# Patient Record
Sex: Male | Born: 1940 | Race: White | Hispanic: No | Marital: Married | Smoking: Current some day smoker
Health system: Southern US, Community
[De-identification: ages and names within clinical notes are randomized; demographics above are authoritative.]

## PROBLEM LIST (undated history)

## (undated) DIAGNOSIS — F1010 Alcohol abuse, uncomplicated: Secondary | ICD-10-CM

## (undated) DIAGNOSIS — R000 Tachycardia, unspecified: Secondary | ICD-10-CM

## (undated) DIAGNOSIS — E785 Hyperlipidemia, unspecified: Secondary | ICD-10-CM

## (undated) DIAGNOSIS — I429 Cardiomyopathy, unspecified: Secondary | ICD-10-CM

## (undated) DIAGNOSIS — I10 Essential (primary) hypertension: Secondary | ICD-10-CM

## (undated) HISTORY — DX: Essential (primary) hypertension: I10

## (undated) HISTORY — DX: Hyperlipidemia, unspecified: E78.5

---

## 1973-06-24 HISTORY — PX: OTHER SURGICAL HISTORY: SHX169

## 2003-01-18 ENCOUNTER — Ambulatory Visit (HOSPITAL_COMMUNITY): Admission: RE | Admit: 2003-01-18 | Discharge: 2003-01-18 | Payer: Self-pay | Admitting: Family Medicine

## 2003-01-18 ENCOUNTER — Encounter: Payer: Self-pay | Admitting: Family Medicine

## 2008-08-10 ENCOUNTER — Ambulatory Visit (HOSPITAL_COMMUNITY): Admission: RE | Admit: 2008-08-10 | Discharge: 2008-08-10 | Payer: Self-pay | Admitting: Family Medicine

## 2010-01-11 IMAGING — US US AORTA
1 series · 14 of 25 positions shown · non-contrast
Comparison: Report from 01/18/2003

CLINICAL DATA: Pulsatile abdominal mass

AORTA/RETROPERITONEAL ULTRASOUND
TECHNIQUE: Complete retroperitoneal ultrasound examination was
performed including evaluation of the abdominal aorta, IVC, common
iliac vessel origins, and kidneys.

[Series 1: us aorta · 0.32mm/px · 14 of 43 slices shown]
[im 1/43]
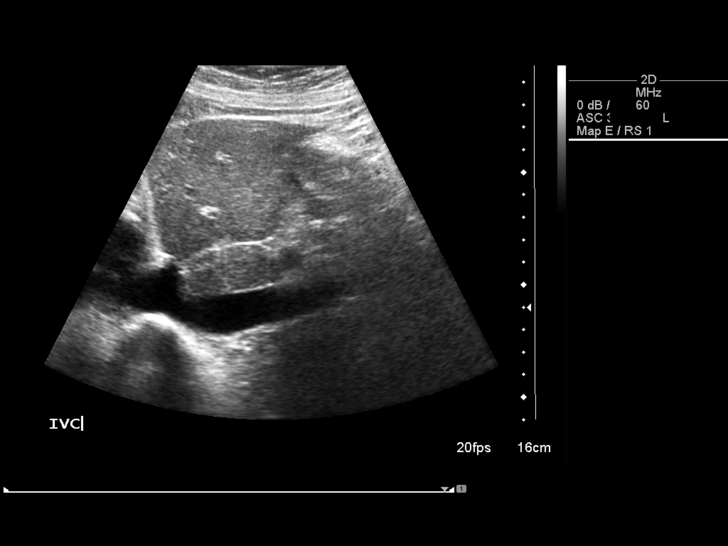
[im 4/43]
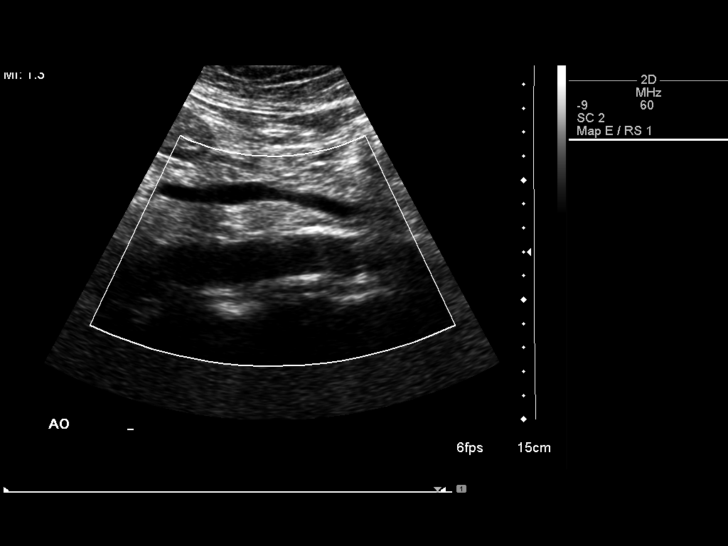
[im 8/43]
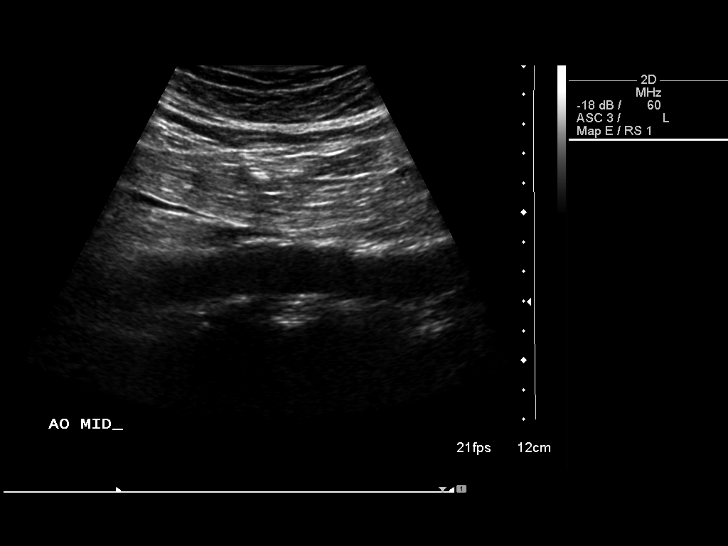
[im 11/43]
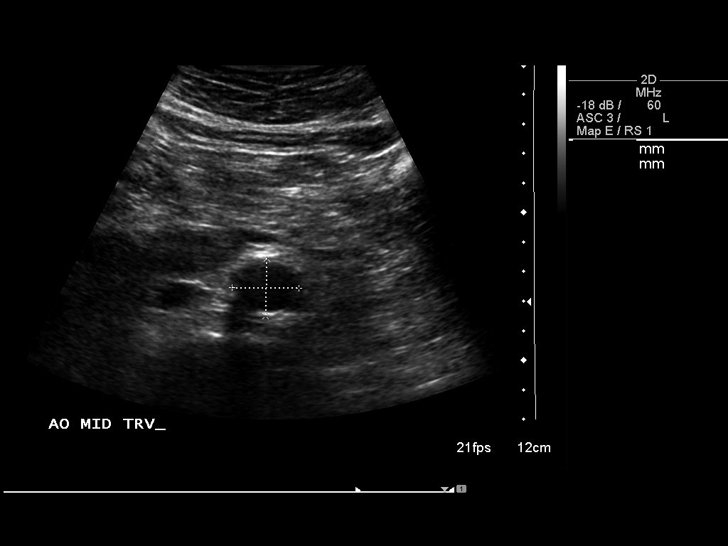
[im 15/43]
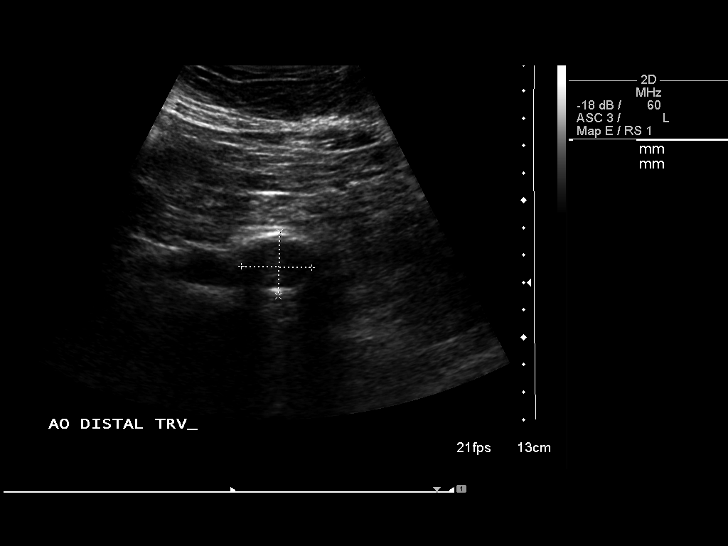
[im 16/43]
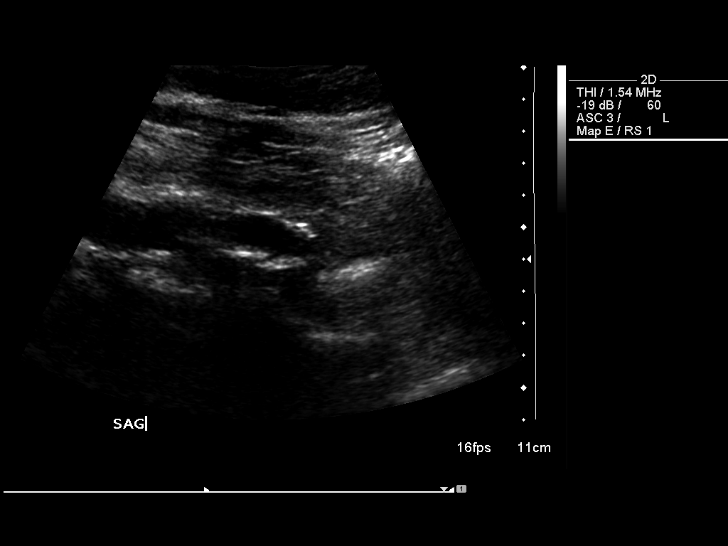
[im 20/43]
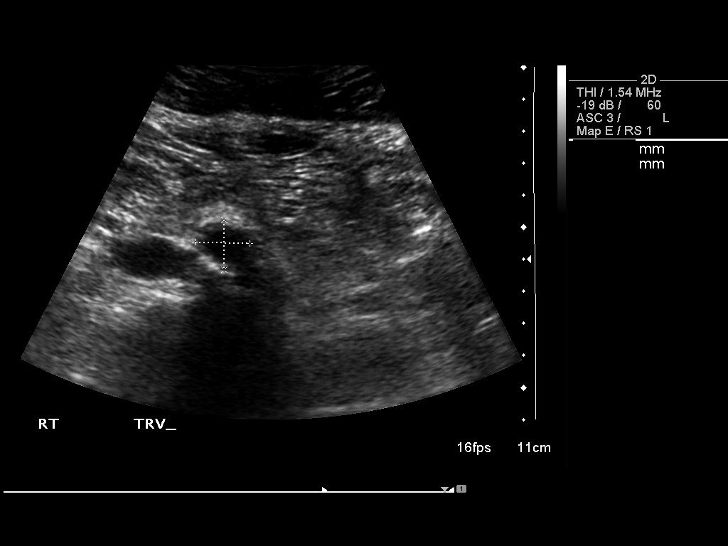
[im 23/43]
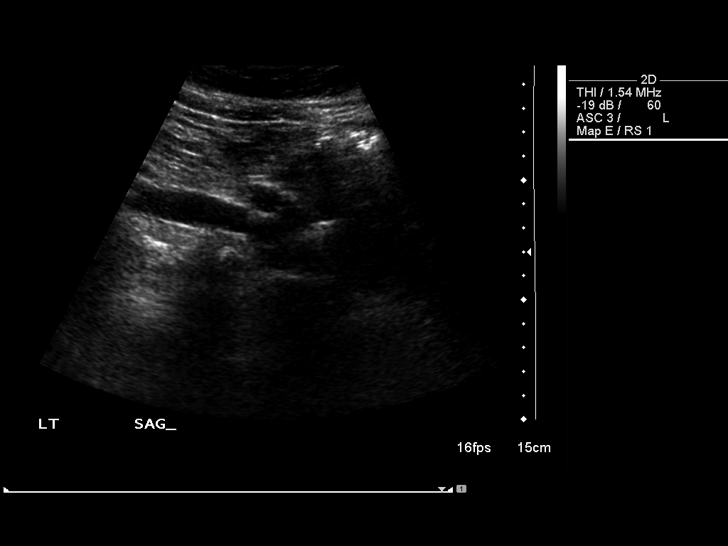
[im 27/43]
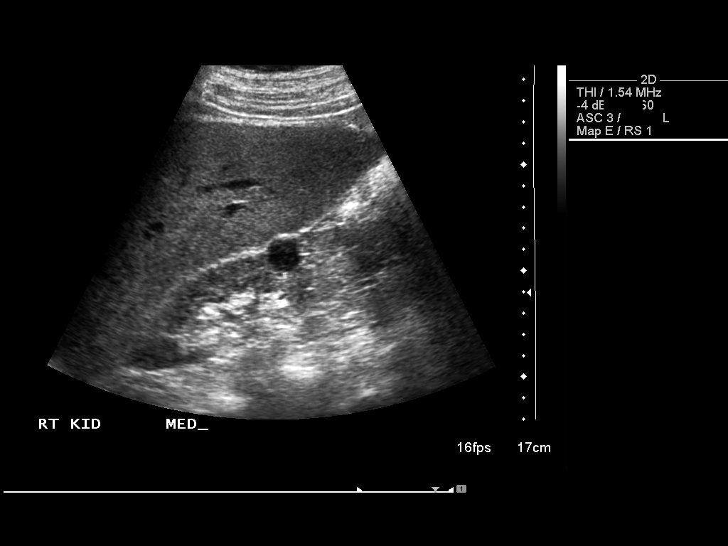
[im 29/43]
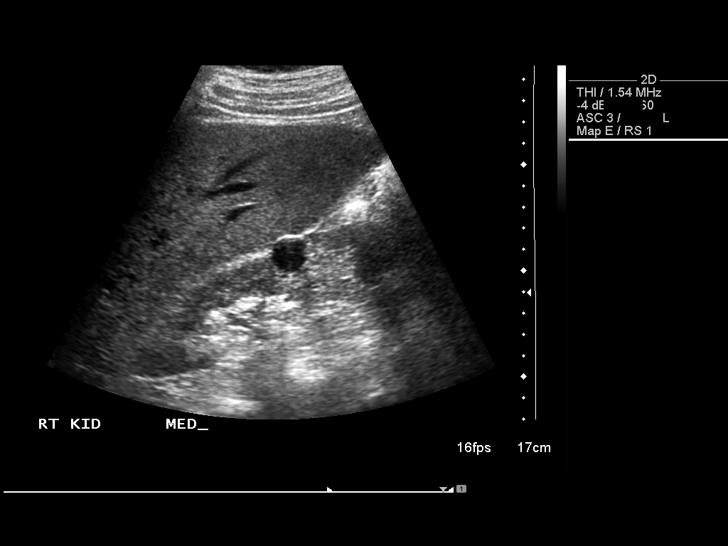
[im 32/43]
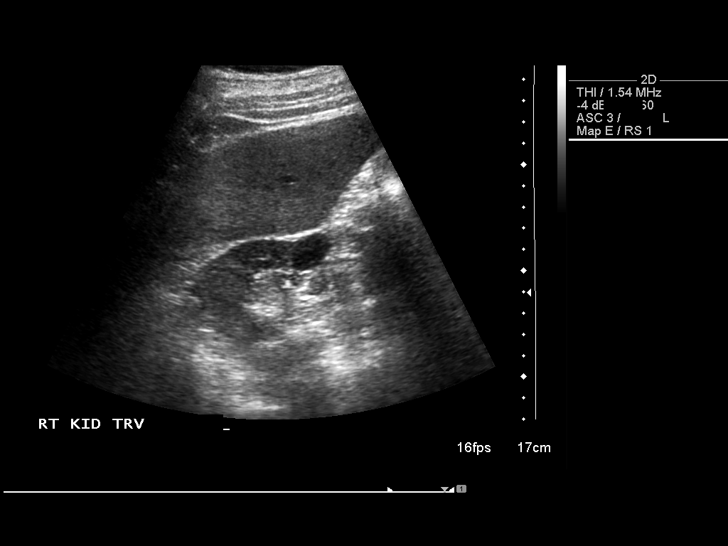
[im 36/43]
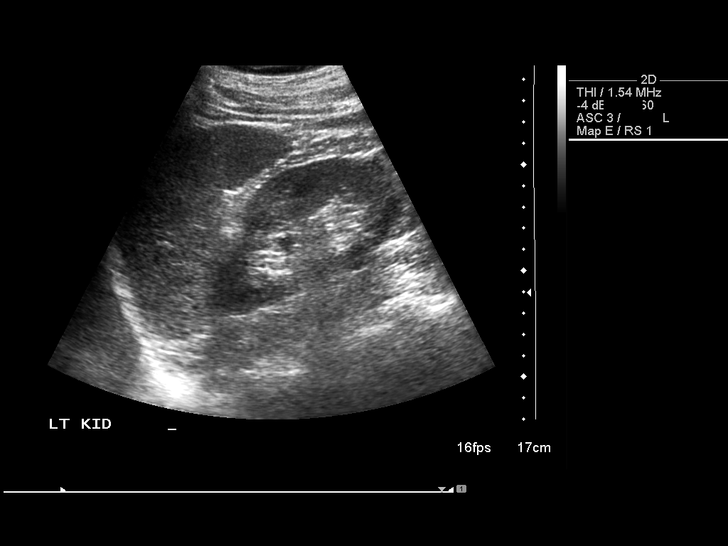
[im 39/43]
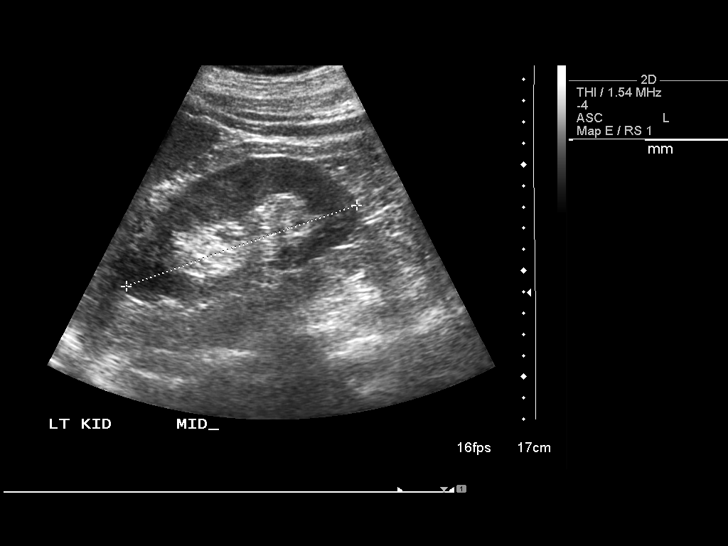
[im 43/43]
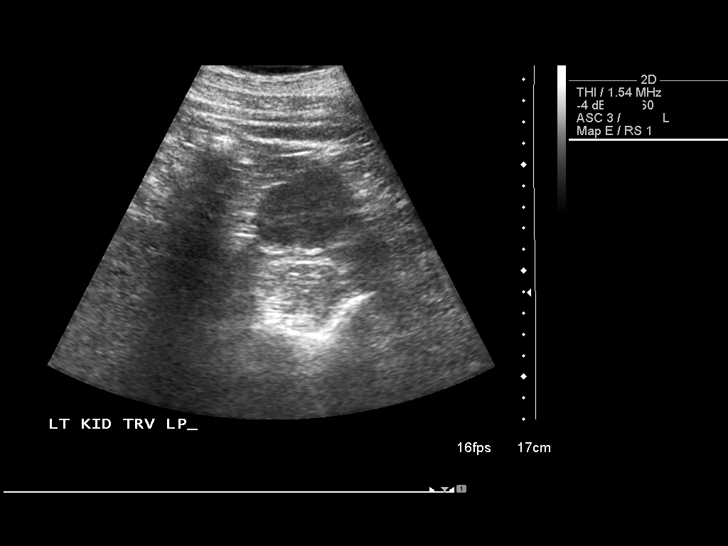

[14 of 25 positions shown; findings below may reference images not displayed]

FINDINGS: Proximally the abdominal aorta measures 2.2 cm AP by
cm transverse.

In the midportion, the abdominal aorta measures 2.0 cm AP by 2.3 cm
transverse.

Distally the abdominal aorta measures 2.3 cm AP by 2.5 cm
transverse.

There is some intimal thickening and mild atheromatous
calcification in the abdominal aorta.

The common iliac arteries do not appear aneurysmal.

The right kidney measures 1.7 cm in greatest length and contains a
1.7 cm simple appearing cyst with enhanced through-transmission in
the mid kidney.

The left kidney measures 11.5 cm in greatest length and appears
unremarkable.

The IVC appears normal.
IMPRESSION: 1.  Mild abdominal aortic ectasia without overt aneurysm.
2.  Simple-appearing right renal cyst.

## 2012-11-20 ENCOUNTER — Telehealth: Payer: Self-pay | Admitting: Family Medicine

## 2012-11-20 DIAGNOSIS — E782 Mixed hyperlipidemia: Secondary | ICD-10-CM

## 2012-11-20 DIAGNOSIS — Z79899 Other long term (current) drug therapy: Secondary | ICD-10-CM

## 2012-11-20 DIAGNOSIS — Z0000 Encounter for general adult medical examination without abnormal findings: Secondary | ICD-10-CM

## 2012-11-20 NOTE — Telephone Encounter (Signed)
Patient has an upcoming appointment 12/02/12 needs paperwork to have blood work completed before this date.  Please call patient when this paperwork is ready.  Thanks

## 2012-11-24 ENCOUNTER — Encounter: Payer: Self-pay | Admitting: *Deleted

## 2012-11-25 NOTE — Telephone Encounter (Signed)
Blood work papers printed and left up front for patient pick up. Patient notified. 

## 2012-11-25 NOTE — Telephone Encounter (Signed)
Glucose, lipid, liver   Rea: hyperlip and screening

## 2012-11-27 LAB — LIPID PANEL
HDL: 33 mg/dL — ABNORMAL LOW (ref >39)
LDL Cholesterol: 96 mg/dL (ref 0–99)

## 2012-11-27 LAB — GLUCOSE, RANDOM: Glucose, Bld: 93 mg/dL (ref 70–99)

## 2012-11-27 LAB — HEPATIC FUNCTION PANEL
ALT: <8 U/L (ref 0–53)
AST: 9 U/L (ref 0–37)
Alkaline Phosphatase: 51 U/L (ref 39–117)
Indirect Bilirubin: 0.5 mg/dL (ref 0.0–0.9)
Total Bilirubin: 0.6 mg/dL (ref 0.3–1.2)

## 2012-12-02 ENCOUNTER — Encounter: Payer: Self-pay | Admitting: Family Medicine

## 2012-12-02 ENCOUNTER — Ambulatory Visit (INDEPENDENT_AMBULATORY_CARE_PROVIDER_SITE_OTHER): Payer: Medicare Other | Admitting: Family Medicine

## 2012-12-02 VITALS — BP 180/86 | Wt 188.4 lb

## 2012-12-02 DIAGNOSIS — I10 Essential (primary) hypertension: Secondary | ICD-10-CM

## 2012-12-02 DIAGNOSIS — J309 Allergic rhinitis, unspecified: Secondary | ICD-10-CM | POA: Insufficient documentation

## 2012-12-02 DIAGNOSIS — E785 Hyperlipidemia, unspecified: Secondary | ICD-10-CM | POA: Insufficient documentation

## 2012-12-02 MED ORDER — PANTOPRAZOLE SODIUM 40 MG PO TBEC: 40.0000 mg | DELAYED_RELEASE_TABLET | Freq: Every day | ORAL | Status: DC

## 2012-12-02 MED ORDER — PRAVASTATIN SODIUM 40 MG PO TABS: 40.0000 mg | ORAL_TABLET | Freq: Every day | ORAL | Status: DC

## 2012-12-02 MED ORDER — LISINOPRIL 20 MG PO TABS: 20.0000 mg | ORAL_TABLET | Freq: Every day | ORAL | Status: DC

## 2012-12-02 MED ORDER — DOXAZOSIN MESYLATE 4 MG PO TABS: 4.0000 mg | ORAL_TABLET | Freq: Every day | ORAL | Status: DC

## 2012-12-02 NOTE — Progress Notes (Signed)
  Subjective:    Patient ID: Kevin Sweeney, male    DOB: 21-May-1941, 72 y.o.   MRN: [id number]  Hyperlipidemia This is a chronic problem. The current episode started more than 1 year ago. Current antihyperlipidemic treatment includes statins.  Hypertension This is a chronic problem. The current episode started more than 1 year ago.  Patient does smoke he knows needs quit he does take his medicines as directed he denies any chest tightness pressure pain shortness breath swelling in legs denies hematuria denies rectal bleeding knows he needs a wellness exam later this year. Takes his medicine as directed. Family history noncontributory.    Review of Systems    ROS see per above Objective:   Physical Exam  Blood pressure elevated but even on recheck moderately elevated 160/80 he states her blood pressure readings outside the office are very good. Lungs are clear hearts regular abdomen soft extremities no edema skin warm dry      Assessment & Plan:  Hypertension continue medication as directed. We recheck blood pressure as an outpatient. As long as his readings are doing good at home been there is no other intervention necessary. Followup if ongoing troubles. Hyperlipidemia lab work looks good continue current measures followup if ongoing troubles Allergic rhinitis Flonase and OTC measures if ongoing troubles may need Depo-Medrol shot. Advise patient to quit smoking Wellness exam later this year.

## 2013-03-28 ENCOUNTER — Other Ambulatory Visit: Payer: Self-pay | Admitting: Family Medicine

## 2013-04-01 ENCOUNTER — Ambulatory Visit (INDEPENDENT_AMBULATORY_CARE_PROVIDER_SITE_OTHER): Payer: Medicare Other | Admitting: *Deleted

## 2013-04-01 DIAGNOSIS — Z23 Encounter for immunization: Secondary | ICD-10-CM

## 2013-08-24 ENCOUNTER — Telehealth: Payer: Self-pay | Admitting: Family Medicine

## 2013-08-24 ENCOUNTER — Other Ambulatory Visit: Payer: Self-pay | Admitting: *Deleted

## 2013-08-24 DIAGNOSIS — I10 Essential (primary) hypertension: Secondary | ICD-10-CM

## 2013-08-24 DIAGNOSIS — E785 Hyperlipidemia, unspecified: Secondary | ICD-10-CM

## 2013-08-24 DIAGNOSIS — Z125 Encounter for screening for malignant neoplasm of prostate: Secondary | ICD-10-CM

## 2013-08-24 DIAGNOSIS — Z79899 Other long term (current) drug therapy: Secondary | ICD-10-CM

## 2013-08-24 NOTE — Telephone Encounter (Signed)
Patient needs order for blood work. °

## 2013-08-24 NOTE — Telephone Encounter (Signed)
Notified patient via VM stating blood work orders are in. 

## 2013-08-24 NOTE — Telephone Encounter (Signed)
Lipid, liver, metabolic 7, PSA, will also need followup office visit

## 2013-08-24 NOTE — Telephone Encounter (Signed)
11/27/12: Glucose, lipid, liver

## 2013-08-30 LAB — LIPID PANEL
Cholesterol: 145 mg/dL (ref 0–200)
HDL: 34 mg/dL — AB (ref >39)
LDL Cholesterol: 84 mg/dL (ref 0–99)
Total CHOL/HDL Ratio: 4.3 Ratio
Triglycerides: 135 mg/dL (ref <150)
VLDL: 27 mg/dL (ref 0–40)

## 2013-08-30 LAB — BASIC METABOLIC PANEL
BUN: 9 mg/dL (ref 6–23)
CALCIUM: 9.5 mg/dL (ref 8.4–10.5)
CO2: 28 mEq/L (ref 19–32)
CREATININE: 0.63 mg/dL (ref 0.50–1.35)
Chloride: 96 mEq/L (ref 96–112)
GLUCOSE: 94 mg/dL (ref 70–99)
Potassium: 4.3 mEq/L (ref 3.5–5.3)
SODIUM: 133 meq/L — AB (ref 135–145)

## 2013-08-30 LAB — HEPATIC FUNCTION PANEL
ALBUMIN: 4.5 g/dL (ref 3.5–5.2)
ALT: 8 U/L (ref 0–53)
AST: 9 U/L (ref 0–37)
Alkaline Phosphatase: 52 U/L (ref 39–117)
BILIRUBIN INDIRECT: 0.5 mg/dL (ref 0.2–1.2)
BILIRUBIN TOTAL: 0.6 mg/dL (ref 0.2–1.2)
Bilirubin, Direct: 0.1 mg/dL (ref 0.0–0.3)
Total Protein: 6.6 g/dL (ref 6.0–8.3)

## 2013-08-31 LAB — PSA, MEDICARE: PSA: 1.05 ng/mL (ref <=4.00)

## 2013-09-03 ENCOUNTER — Ambulatory Visit (INDEPENDENT_AMBULATORY_CARE_PROVIDER_SITE_OTHER): Payer: Medicare Other | Admitting: Family Medicine

## 2013-09-03 ENCOUNTER — Encounter: Payer: Self-pay | Admitting: Family Medicine

## 2013-09-03 VITALS — BP 148/78 | Ht 71.0 in | Wt 192.0 lb

## 2013-09-03 DIAGNOSIS — E785 Hyperlipidemia, unspecified: Secondary | ICD-10-CM

## 2013-09-03 DIAGNOSIS — I10 Essential (primary) hypertension: Secondary | ICD-10-CM

## 2013-09-03 DIAGNOSIS — J309 Allergic rhinitis, unspecified: Secondary | ICD-10-CM

## 2013-09-03 DIAGNOSIS — Z85038 Personal history of other malignant neoplasm of large intestine: Secondary | ICD-10-CM

## 2013-09-03 MED ORDER — LISINOPRIL 20 MG PO TABS: 20.0000 mg | ORAL_TABLET | Freq: Every day | ORAL | Status: DC

## 2013-09-03 MED ORDER — PANTOPRAZOLE SODIUM 40 MG PO TBEC: 40.0000 mg | DELAYED_RELEASE_TABLET | Freq: Every day | ORAL | Status: DC

## 2013-09-03 MED ORDER — DOXAZOSIN MESYLATE 4 MG PO TABS
4.0000 mg | ORAL_TABLET | Freq: Every day | ORAL | Status: DC
Start: 2013-09-03 — End: 2014-05-11

## 2013-09-03 MED ORDER — PRAVASTATIN SODIUM 40 MG PO TABS: 40.0000 mg | ORAL_TABLET | Freq: Every day | ORAL | Status: DC

## 2013-09-03 MED ORDER — FLUTICASONE PROPIONATE 50 MCG/ACT NA SUSP: NASAL | Status: DC

## 2013-09-03 NOTE — Progress Notes (Signed)
   Subjective:    Patient ID: Kevin Sweeney, male    DOB: 23-Mar-1941, 73 y.o.   MRN: [id number]  HPI Patient is here today for a check up. Patient was talked to at length about the importance of exercise watching diet. He is doing a good job taken his medicine. He denies any acute issues currently. Had his blood work done. We also talked about preventative health he does not want prostate exam or colonoscopy. He recently got BW done.  Suffering from allergies. Patient does have significant allergies sneezing congestion drainage coughing denies any other particular troubles currently  Patient not smoking he use to but no longer does  Review of Systems  Constitutional: Negative for activity change, appetite change and fatigue.  Respiratory: Negative for cough, choking and chest tightness.   Cardiovascular: Negative for chest pain and leg swelling.  Gastrointestinal: Negative for abdominal pain and abdominal distention.  Endocrine: Negative for polydipsia and polyphagia.  Neurological: Negative for weakness.  Psychiatric/Behavioral: Negative for confusion.       Objective:   Physical Exam  Vitals reviewed. Constitutional: He appears well-nourished. No distress.  Cardiovascular: Normal rate, regular rhythm and normal heart sounds.   No murmur heard. Pulmonary/Chest: Effort normal and breath sounds normal. No respiratory distress.  Abdominal: Soft. He exhibits no distension. There is no tenderness. There is no guarding.  Musculoskeletal: He exhibits no edema.  Lymphadenopathy:    He has no cervical adenopathy.  Neurological: He is alert.  Psychiatric: His behavior is normal.          Assessment & Plan:  #1 allergic rhinitis try Allegra continue Flonase if ongoing trouble followup  #2 hyperlipidemia under good control continue current measures  HTN blood pressure recheck actually better. Continue current medication watch diet patient does not smoke cigarettes an occasional  cigar. He also states not consuming alcohol currently.  Patient defers on colonoscopy agrees to Hemoccult cards x3.

## 2013-09-08 ENCOUNTER — Other Ambulatory Visit: Payer: Self-pay | Admitting: *Deleted

## 2013-09-08 ENCOUNTER — Telehealth: Payer: Self-pay | Admitting: Family Medicine

## 2013-09-08 MED ORDER — PRAVASTATIN SODIUM 40 MG PO TABS: 40.0000 mg | ORAL_TABLET | Freq: Two times a day (BID) | ORAL | Status: DC

## 2013-09-08 NOTE — Telephone Encounter (Signed)
Pt is currently on 40mg  PRAVACHOL 2 tablets daily, insurance is requiring a quantity limit override, can we change pt to 80mg  once daily (should eliminate need for quantity limit override form) Please advise.  If "ok" to change dose, please send in to [facility]

## 2013-09-13 ENCOUNTER — Other Ambulatory Visit: Payer: Self-pay | Admitting: *Deleted

## 2013-09-13 MED ORDER — PRAVASTATIN SODIUM 40 MG PO TABS: ORAL_TABLET | ORAL | Status: DC

## 2013-09-20 MED ORDER — PRAVASTATIN SODIUM 80 MG PO TABS: 80.0000 mg | ORAL_TABLET | Freq: Every day | ORAL | Status: DC

## 2013-09-20 NOTE — Telephone Encounter (Signed)
Nurses may send in script for 80 mg one daily, may do 30 or 90 day whatever pt prefers, with 6 months refills

## 2013-09-20 NOTE — Telephone Encounter (Signed)
Please see previous message, can we change pt to 80mg  daily of Pravachol?  Insurance is requiring reason for 40mg  BID dose.  Please advise

## 2013-09-20 NOTE — Telephone Encounter (Signed)
Medication sent; patient notified and verbalized understanding

## 2013-10-07 ENCOUNTER — Ambulatory Visit (INDEPENDENT_AMBULATORY_CARE_PROVIDER_SITE_OTHER): Payer: Medicare Other | Admitting: Family Medicine

## 2013-10-07 ENCOUNTER — Encounter: Payer: Self-pay | Admitting: Family Medicine

## 2013-10-07 VITALS — BP 180/90 | Temp 98.5°F | Ht 71.0 in | Wt 188.4 lb

## 2013-10-07 DIAGNOSIS — J309 Allergic rhinitis, unspecified: Secondary | ICD-10-CM

## 2013-10-07 DIAGNOSIS — J302 Other seasonal allergic rhinitis: Secondary | ICD-10-CM

## 2013-10-07 MED ORDER — METHYLPREDNISOLONE ACETATE 80 MG/ML IJ SUSP
80.0000 mg | Freq: Once | INTRAMUSCULAR | Status: AC
  Administered 2013-10-07: 80 mg via INTRAMUSCULAR

## 2013-10-07 NOTE — Progress Notes (Signed)
   Subjective:    Patient ID: Kevin Sweeney, male    DOB: 20-May-1941, 73 y.o.   MRN: [id number]  Sinusitis This is a new problem. The current episode started in the past 7 days. The problem is unchanged. There has been no fever. The pain is mild. Associated symptoms include congestion, headaches and sneezing. (Watery eyes) Past treatments include spray decongestants (allegra). The treatment provided no relief.   Patient states he has no other concerns at this time.   No vomiting or diarrhea. Review of Systems  HENT: Positive for congestion and sneezing.   Neurological: Positive for headaches.   ROS otherwise negative    Objective:   Physical Exam  Alert mild malaise. HEENT moderate nasal congestion clear discharge. Eyes injected pharynx normal neck supple. Lungs clear. Heart regular in rhythm.  Blood pressure improved to 152/84.    Assessment & Plan:  Impression significant allergic rhinitis plan maintain antihistamine and steroid nasal spray. Add steroid injection. Rationale discussed. WSL

## 2014-03-02 ENCOUNTER — Ambulatory Visit: Payer: Medicare Other | Admitting: Family Medicine

## 2014-04-13 ENCOUNTER — Ambulatory Visit (INDEPENDENT_AMBULATORY_CARE_PROVIDER_SITE_OTHER): Payer: Medicare Other | Admitting: *Deleted

## 2014-04-13 DIAGNOSIS — Z23 Encounter for immunization: Secondary | ICD-10-CM

## 2014-04-27 ENCOUNTER — Telehealth: Payer: Self-pay | Admitting: Family Medicine

## 2014-04-27 DIAGNOSIS — Z79899 Other long term (current) drug therapy: Secondary | ICD-10-CM

## 2014-04-27 DIAGNOSIS — E785 Hyperlipidemia, unspecified: Secondary | ICD-10-CM

## 2014-04-27 NOTE — Telephone Encounter (Signed)
Met 7 lipid liv

## 2014-04-27 NOTE — Telephone Encounter (Signed)
Patient needs order for blood work for upcoming appointment on 05/11/14.

## 2014-04-27 NOTE — Telephone Encounter (Signed)
Last labs 08/30/13 BMP, PSA, Lipid, Liver

## 2014-04-28 NOTE — Telephone Encounter (Signed)
Left message on voicemail notifying patient that blood work has been ordered.  

## 2014-05-06 LAB — HEPATIC FUNCTION PANEL
ALK PHOS: 57 U/L (ref 39–117)
ALT: 8 U/L (ref 0–53)
AST: 9 U/L (ref 0–37)
Albumin: 4.3 g/dL (ref 3.5–5.2)
BILIRUBIN DIRECT: 0.1 mg/dL (ref 0.0–0.3)
BILIRUBIN TOTAL: 0.5 mg/dL (ref 0.2–1.2)
Indirect Bilirubin: 0.4 mg/dL (ref 0.2–1.2)
Total Protein: 6.4 g/dL (ref 6.0–8.3)

## 2014-05-06 LAB — BASIC METABOLIC PANEL
BUN: 7 mg/dL (ref 6–23)
CALCIUM: 9.3 mg/dL (ref 8.4–10.5)
CHLORIDE: 91 meq/L — AB (ref 96–112)
CO2: 27 meq/L (ref 19–32)
CREATININE: 0.64 mg/dL (ref 0.50–1.35)
GLUCOSE: 100 mg/dL — AB (ref 70–99)
Potassium: 4.3 mEq/L (ref 3.5–5.3)
Sodium: 129 mEq/L — ABNORMAL LOW (ref 135–145)

## 2014-05-06 LAB — LIPID PANEL
Cholesterol: 138 mg/dL (ref 0–200)
HDL: 36 mg/dL — ABNORMAL LOW (ref >39)
LDL Cholesterol: 77 mg/dL (ref 0–99)
Total CHOL/HDL Ratio: 3.8 Ratio
Triglycerides: 124 mg/dL (ref <150)
VLDL: 25 mg/dL (ref 0–40)

## 2014-05-11 ENCOUNTER — Encounter: Payer: Self-pay | Admitting: Family Medicine

## 2014-05-11 ENCOUNTER — Ambulatory Visit (INDEPENDENT_AMBULATORY_CARE_PROVIDER_SITE_OTHER): Payer: Medicare Other | Admitting: Family Medicine

## 2014-05-11 VITALS — BP 170/90 | Ht 71.0 in | Wt 180.0 lb

## 2014-05-11 DIAGNOSIS — I10 Essential (primary) hypertension: Secondary | ICD-10-CM

## 2014-05-11 DIAGNOSIS — Z23 Encounter for immunization: Secondary | ICD-10-CM

## 2014-05-11 DIAGNOSIS — E785 Hyperlipidemia, unspecified: Secondary | ICD-10-CM

## 2014-05-11 DIAGNOSIS — E871 Hypo-osmolality and hyponatremia: Secondary | ICD-10-CM

## 2014-05-11 DIAGNOSIS — Z1212 Encounter for screening for malignant neoplasm of rectum: Secondary | ICD-10-CM

## 2014-05-11 MED ORDER — PRAVASTATIN SODIUM 80 MG PO TABS: 80.0000 mg | ORAL_TABLET | Freq: Every day | ORAL | Status: DC

## 2014-05-11 MED ORDER — LISINOPRIL 20 MG PO TABS: 20.0000 mg | ORAL_TABLET | Freq: Every day | ORAL | Status: DC

## 2014-05-11 MED ORDER — DOXAZOSIN MESYLATE 4 MG PO TABS: 4.0000 mg | ORAL_TABLET | Freq: Every day | ORAL | Status: DC

## 2014-05-11 MED ORDER — AMLODIPINE BESYLATE 2.5 MG PO TABS: 2.5000 mg | ORAL_TABLET | Freq: Every day | ORAL | Status: DC

## 2014-05-11 MED ORDER — PANTOPRAZOLE SODIUM 40 MG PO TBEC: 40.0000 mg | DELAYED_RELEASE_TABLET | Freq: Every day | ORAL | Status: DC

## 2014-05-11 NOTE — Progress Notes (Signed)
   Subjective:    Patient ID: Kevin Sweeney, male    DOB: May 05, 1941, 73 y.o.   MRN: [id number]  Hypertension This is a chronic problem. The current episode started more than 1 year ago. The problem is controlled.   Had BW done. 25 minutes spent with patient covering multiple items Suffers from environmental allergies. Significant time was spent discussing the patient's health he is trying to eat healthy stay physically active. He denies appetite change and has lost some weight denies any chronic cough denies hemoptysis or rectal bleeding. Patient was advised to get colonoscopy he defers on this we will do Hemoccult cards 3 No new concerns.   Review of Systems     Objective:   Physical Exam Lungs clear heart regular Abdomen soft Extremities no edema Skin warm dry       Assessment & Plan:  hyponatremia-we will need to recheck this again in several weeks if it is still low he will need to have significant workup for this Allergy issues use medications as directed Hyperlipidemia good control continue current medications Reflux good control continue current medication HTN addamlodipine 2.5 mg daily Follow-up patient in approximate 4-6 weeks to recheck blood pressure

## 2014-06-03 LAB — BASIC METABOLIC PANEL
BUN: 6 mg/dL (ref 6–23)
CALCIUM: 9.3 mg/dL (ref 8.4–10.5)
CO2: 29 mEq/L (ref 19–32)
CREATININE: 0.67 mg/dL (ref 0.50–1.35)
Chloride: 96 mEq/L (ref 96–112)
GLUCOSE: 90 mg/dL (ref 70–99)
Potassium: 4.1 mEq/L (ref 3.5–5.3)
Sodium: 131 mEq/L — ABNORMAL LOW (ref 135–145)

## 2014-06-08 ENCOUNTER — Ambulatory Visit (INDEPENDENT_AMBULATORY_CARE_PROVIDER_SITE_OTHER): Payer: Medicare Other | Admitting: Family Medicine

## 2014-06-08 ENCOUNTER — Encounter: Payer: Self-pay | Admitting: Family Medicine

## 2014-06-08 VITALS — BP 170/94 | Ht 71.0 in | Wt 178.0 lb

## 2014-06-08 DIAGNOSIS — I10 Essential (primary) hypertension: Secondary | ICD-10-CM

## 2014-06-08 DIAGNOSIS — R5383 Other fatigue: Secondary | ICD-10-CM

## 2014-06-08 DIAGNOSIS — R05 Cough: Secondary | ICD-10-CM

## 2014-06-08 DIAGNOSIS — R059 Cough, unspecified: Secondary | ICD-10-CM

## 2014-06-08 DIAGNOSIS — E871 Hypo-osmolality and hyponatremia: Secondary | ICD-10-CM | POA: Insufficient documentation

## 2014-06-08 MED ORDER — AMLODIPINE BESYLATE 5 MG PO TABS: ORAL_TABLET | ORAL | Status: DC

## 2014-06-08 NOTE — Progress Notes (Signed)
   Subjective:    Patient ID: Kevin Sweeney, male    DOB: May 10, 1941, 73 y.o.   MRN: [id number]  Hypertension This is a chronic problem. The current episode started more than 1 year ago. Pertinent negatives include no chest pain. Treatments tried: amlodipine, lisinopril. There are no compliance problems.   Walks and plays golf. Could do better with diet.  Started amlodipine a few weeks ago. Pt states he has felt tired since starting the med.  Patient brought in blood pressure readings today.  No other concerns today.     Review of Systems  Constitutional: Negative for activity change, appetite change and fatigue.  HENT: Negative for congestion.   Respiratory: Negative for cough.   Cardiovascular: Negative for chest pain.  Gastrointestinal: Negative for abdominal pain.  Endocrine: Negative for polydipsia and polyphagia.  Neurological: Negative for weakness.  Psychiatric/Behavioral: Negative for confusion.       Objective:   Physical Exam  Constitutional: He appears well-nourished. No distress.  Cardiovascular: Normal rate, regular rhythm and normal heart sounds.   No murmur heard. Pulmonary/Chest: Effort normal and breath sounds normal. No respiratory distress.  Musculoskeletal: He exhibits no edema.  Lymphadenopathy:    He has no cervical adenopathy.  Neurological: He is alert.  Psychiatric: His behavior is normal.  Vitals reviewed.         Assessment & Plan:  #1 HTN he brought in a bunch her readings I looked over those about 40% of the readings have elevated systolic blood pressure therefore I recommend increasing amlodipine 5 mg daily. He will send [place] readings in a few weeks time he will follow-up in 3 months with [place]  #2 he states he takes a large amount of liquids we will be checking some further testing to rule out other causes of hyponatremia but he will try to reduce the amount of fluids he takes then and he will get these tests done within the next 2 weeks   #3  intermittent cough history of smoking in the past I recommend a chest x-ray I doubt SIADH but need to make sure there is no no sign of any growth in the lung.. Patient denies any fevers weight loss hemoptysis or ill filling

## 2014-08-15 ENCOUNTER — Telehealth: Payer: Self-pay | Admitting: Family Medicine

## 2014-08-15 NOTE — Telephone Encounter (Signed)
Pt dropped of blood pressure readings. In basket in folder.

## 2014-08-18 NOTE — Telephone Encounter (Signed)
Discussed with patient

## 2014-08-18 NOTE — Telephone Encounter (Signed)
Blood pressure readings overall look good occasionally the readings are high but the vast majority look good. If we increase the dose of his medication he is more likely to have side effects we will keep everything as is. Follow-up in the spring.

## 2014-09-13 ENCOUNTER — Other Ambulatory Visit: Payer: Self-pay | Admitting: Family Medicine

## 2014-09-28 ENCOUNTER — Encounter: Payer: Self-pay | Admitting: Family Medicine

## 2014-09-28 LAB — BASIC METABOLIC PANEL
BUN: 8 mg/dL (ref 6–23)
CALCIUM: 9.3 mg/dL (ref 8.4–10.5)
CO2: 28 meq/L (ref 19–32)
CREATININE: 0.62 mg/dL (ref 0.50–1.35)
Chloride: 98 mEq/L (ref 96–112)
Glucose, Bld: 101 mg/dL — ABNORMAL HIGH (ref 70–99)
Potassium: 4.1 mEq/L (ref 3.5–5.3)
Sodium: 136 mEq/L (ref 135–145)

## 2014-09-28 LAB — SODIUM, URINE, RANDOM: Sodium, Ur: 194 mEq/L

## 2014-09-28 LAB — CORTISOL: Cortisol, Plasma: 15 ug/dL

## 2014-09-28 LAB — TSH: TSH: 0.64 u[IU]/mL (ref 0.350–4.500)

## 2014-09-29 ENCOUNTER — Ambulatory Visit: Payer: Medicare Other | Admitting: Family Medicine

## 2015-02-21 ENCOUNTER — Other Ambulatory Visit: Payer: Self-pay | Admitting: Family Medicine

## 2015-03-22 ENCOUNTER — Ambulatory Visit (INDEPENDENT_AMBULATORY_CARE_PROVIDER_SITE_OTHER): Payer: PPO | Admitting: Family Medicine

## 2015-03-22 ENCOUNTER — Encounter: Payer: Self-pay | Admitting: Family Medicine

## 2015-03-22 VITALS — BP 160/90 | Ht 71.0 in | Wt 174.4 lb

## 2015-03-22 DIAGNOSIS — Z79899 Other long term (current) drug therapy: Secondary | ICD-10-CM | POA: Diagnosis not present

## 2015-03-22 DIAGNOSIS — Z23 Encounter for immunization: Secondary | ICD-10-CM

## 2015-03-22 DIAGNOSIS — Z1211 Encounter for screening for malignant neoplasm of colon: Secondary | ICD-10-CM

## 2015-03-22 DIAGNOSIS — E785 Hyperlipidemia, unspecified: Secondary | ICD-10-CM

## 2015-03-22 DIAGNOSIS — I10 Essential (primary) hypertension: Secondary | ICD-10-CM | POA: Diagnosis not present

## 2015-03-22 MED ORDER — LISINOPRIL 20 MG PO TABS: 20.0000 mg | ORAL_TABLET | Freq: Every day | ORAL | Status: DC

## 2015-03-22 MED ORDER — AMLODIPINE BESYLATE 5 MG PO TABS: ORAL_TABLET | ORAL | Status: DC

## 2015-03-22 MED ORDER — DOXAZOSIN MESYLATE 4 MG PO TABS: 4.0000 mg | ORAL_TABLET | Freq: Every day | ORAL | Status: DC

## 2015-03-22 MED ORDER — PANTOPRAZOLE SODIUM 40 MG PO TBEC: 40.0000 mg | DELAYED_RELEASE_TABLET | Freq: Every day | ORAL | Status: DC

## 2015-03-22 MED ORDER — FLUTICASONE PROPIONATE 50 MCG/ACT NA SUSP: NASAL | Status: DC

## 2015-03-22 MED ORDER — PRAVASTATIN SODIUM 80 MG PO TABS: 80.0000 mg | ORAL_TABLET | Freq: Every day | ORAL | Status: DC

## 2015-03-22 MED ORDER — HYDROCHLOROTHIAZIDE 25 MG PO TABS: 25.0000 mg | ORAL_TABLET | Freq: Every day | ORAL | Status: DC

## 2015-03-22 NOTE — Progress Notes (Signed)
   Subjective:    Patient ID: Kevin Sweeney, male    DOB: [birth year], 74 y.o.   MRN: [id number]  Hyperlipidemia This is a chronic problem. The current episode started more than 1 year ago. Pertinent negatives include no chest pain. There are no compliance problems.    Patient states no other concerns this visit.   patient states he is having some reflux issues when he does not take his medicine he takes his medicine does well He denies chest tightness pressure pain or shortness of breath He does try to watch his diet  He does do some activity and also plays golf   has hypertension checks his blood pressure regularly most of time pressure readings look good occasionally they are elevated he denies headaches with this States his bowels move fine he is not interested in a colonoscopy.   PMH see history greater than 25 minutes spent with patient covering multiple different items, greater than half in discussion of issues Review of Systems  Constitutional: Negative for activity change, appetite change and fatigue.  HENT: Negative for congestion.   Respiratory: Negative for cough.   Cardiovascular: Negative for chest pain.  Gastrointestinal: Negative for abdominal pain.  Endocrine: Negative for polydipsia and polyphagia.  Neurological: Negative for weakness.  Psychiatric/Behavioral: Negative for confusion.       Objective:   Physical Exam   lungs are clear hearts regular pulse normal abdomen soft extremities no edema skin warm dry      Assessment & Plan:   patient defers colonoscopy. Heme occult cards recommended  hyperlipidemia continue medication check lab work  HTN subpar control add HCTZ 25 mg daily patient to recheck blood pressure regular basis and back to [place] over the next month follow-up in 4-5 months  reflux under good control denies problems with it as long as he takes his medicine continue medication if any problems let us know  urination doing well denies hematuria

## 2015-07-17 ENCOUNTER — Telehealth: Payer: Self-pay | Admitting: Family Medicine

## 2015-07-17 NOTE — Telephone Encounter (Signed)
Pt dropped off his blood pressure readings. In yellow folder at nurse's station.

## 2015-07-18 NOTE — Telephone Encounter (Signed)
Discussed with pt. Pt verbalized understanding. Pt states he will call back and schedule a follow up office visit.

## 2015-07-18 NOTE — Telephone Encounter (Signed)
Recent blood pressure readings look fairly good could be a little better. Some are better than others. Maintain current medications. Please follow-up within 4-6 weeks. Heart healthy diet regular physical activity recommended. Bring all medications on follow-up.

## 2015-09-26 ENCOUNTER — Other Ambulatory Visit: Payer: Self-pay | Admitting: Family Medicine

## 2015-10-11 ENCOUNTER — Other Ambulatory Visit: Payer: Self-pay | Admitting: Family Medicine

## 2015-10-11 DIAGNOSIS — E785 Hyperlipidemia, unspecified: Secondary | ICD-10-CM | POA: Diagnosis not present

## 2015-10-11 DIAGNOSIS — Z79899 Other long term (current) drug therapy: Secondary | ICD-10-CM | POA: Diagnosis not present

## 2015-10-11 DIAGNOSIS — I10 Essential (primary) hypertension: Secondary | ICD-10-CM | POA: Diagnosis not present

## 2015-10-12 LAB — HEPATIC FUNCTION PANEL
ALT: 11 IU/L (ref 0–44)
AST: 13 IU/L (ref 0–40)
Albumin: 4.4 g/dL (ref 3.6–4.8)
Alkaline Phosphatase: 62 IU/L (ref 39–117)
BILIRUBIN, DIRECT: 0.26 mg/dL (ref 0.00–0.40)
Bilirubin Total: 0.8 mg/dL (ref 0.0–1.2)
Total Protein: 6.5 g/dL (ref 6.0–8.5)

## 2015-10-12 LAB — LIPID PANEL
CHOL/HDL RATIO: 2.7 ratio (ref 0.0–5.0)
Cholesterol, Total: 131 mg/dL (ref 100–199)
HDL: 49 mg/dL (ref >39)
LDL Calculated: 68 mg/dL (ref 0–99)
TRIGLYCERIDES: 69 mg/dL (ref 0–149)
VLDL Cholesterol Cal: 14 mg/dL (ref 5–40)

## 2015-10-12 LAB — BASIC METABOLIC PANEL
BUN / CREAT RATIO: 11 (ref 10–24)
BUN: 8 mg/dL (ref 8–27)
CO2: 27 mmol/L (ref 18–29)
CREATININE: 0.71 mg/dL — AB (ref 0.76–1.27)
Calcium: 9.2 mg/dL (ref 8.6–10.2)
Chloride: 85 mmol/L — ABNORMAL LOW (ref 96–106)
GFR, EST AFRICAN AMERICAN: 116 mL/min/{1.73_m2} (ref >59)
GFR, EST NON AFRICAN AMERICAN: 101 mL/min/{1.73_m2} (ref >59)
Glucose: 95 mg/dL (ref 65–99)
Potassium: 4.2 mmol/L (ref 3.5–5.2)
Sodium: 128 mmol/L — ABNORMAL LOW (ref 134–144)

## 2015-10-19 ENCOUNTER — Ambulatory Visit (INDEPENDENT_AMBULATORY_CARE_PROVIDER_SITE_OTHER): Payer: PPO | Admitting: Family Medicine

## 2015-10-19 ENCOUNTER — Encounter: Payer: Self-pay | Admitting: Family Medicine

## 2015-10-19 VITALS — BP 144/96 | Ht 71.0 in | Wt 182.0 lb

## 2015-10-19 DIAGNOSIS — Z1211 Encounter for screening for malignant neoplasm of colon: Secondary | ICD-10-CM | POA: Diagnosis not present

## 2015-10-19 DIAGNOSIS — E871 Hypo-osmolality and hyponatremia: Secondary | ICD-10-CM

## 2015-10-19 DIAGNOSIS — E785 Hyperlipidemia, unspecified: Secondary | ICD-10-CM

## 2015-10-19 DIAGNOSIS — D229 Melanocytic nevi, unspecified: Secondary | ICD-10-CM

## 2015-10-19 DIAGNOSIS — I10 Essential (primary) hypertension: Secondary | ICD-10-CM

## 2015-10-19 LAB — POC HEMOCCULT BLD/STL (HOME/3-CARD/SCREEN)
Card #2 Fecal Occult Blod, POC: NEGATIVE
Card #3 Fecal Occult Blood, POC: NEGATIVE
Fecal Occult Blood, POC: NEGATIVE

## 2015-10-19 MED ORDER — PANTOPRAZOLE SODIUM 40 MG PO TBEC: 40.0000 mg | DELAYED_RELEASE_TABLET | Freq: Every day | ORAL | Status: DC

## 2015-10-19 MED ORDER — PRAVASTATIN SODIUM 80 MG PO TABS: 80.0000 mg | ORAL_TABLET | Freq: Every day | ORAL | Status: DC

## 2015-10-19 MED ORDER — LISINOPRIL 20 MG PO TABS: 20.0000 mg | ORAL_TABLET | Freq: Every day | ORAL | Status: DC

## 2015-10-19 MED ORDER — AMLODIPINE BESYLATE 5 MG PO TABS: ORAL_TABLET | ORAL | Status: DC

## 2015-10-19 NOTE — Progress Notes (Signed)
   Subjective:    Patient ID: Kevin Sweeney, male    DOB: Apr 19, 1941, 75 y.o.   MRN: [id number]  Hyperlipidemia This is a chronic problem. The current episode started more than 1 year ago. Pertinent negatives include no chest pain. Compliance problems: eats healthy, walks every day, plays golf.    Pt brought in bp readings. He stopped HCTZ because it was causing cramps in legs.  Patient states HCTZ cause muscle cramps he would like to try something different he is only taking 2.5 of amlodipine. Patient was counseled regarding colon cancer but does not want to have colonoscopy he did have Hemoccult cards 3 these are negative He states he stopped taking HCTZ just few days ago his sodium is low it could be related to that he will need to have a repeat of a metabolic 7 Skin exam was done today and showed abnormal mole he will need referral to dermatology for this. Review of Systems  Constitutional: Negative for activity change, appetite change and fatigue.  HENT: Negative for congestion.   Respiratory: Negative for cough.   Cardiovascular: Negative for chest pain.  Gastrointestinal: Negative for abdominal pain.  Endocrine: Negative for polydipsia and polyphagia.  Neurological: Negative for weakness.  Psychiatric/Behavioral: Negative for confusion.       Objective:   Physical Exam  Constitutional: He appears well-nourished. No distress.  Cardiovascular: Normal rate, regular rhythm and normal heart sounds.   No murmur heard. Pulmonary/Chest: Effort normal and breath sounds normal. No respiratory distress.  Musculoskeletal: He exhibits no edema.  Lymphadenopathy:    He has no cervical adenopathy.  Neurological: He is alert.  Psychiatric: His behavior is normal.  Vitals reviewed.         Assessment & Plan:  Elevated blood pressure/HTN-patient will increase amlodipine from 2.5 new dose 5 mg daily continue other medications watch diet he will check blood pressures at home and her sent  back to [place]  Abnormal mole on the his back with multi colors concerning for the possibility of melanoma referral to dermatology for removal  Patient encouraged quit smoking  Patient not interested in colonoscopy  Hyponatremia repeat metabolic 7 avoid excessive water intake.  Hyperlipidemia good control currently

## 2015-10-25 ENCOUNTER — Encounter: Payer: Self-pay | Admitting: Family Medicine

## 2015-11-06 DIAGNOSIS — D1801 Hemangioma of skin and subcutaneous tissue: Secondary | ICD-10-CM | POA: Diagnosis not present

## 2015-11-06 DIAGNOSIS — D485 Neoplasm of uncertain behavior of skin: Secondary | ICD-10-CM | POA: Diagnosis not present

## 2015-11-16 ENCOUNTER — Encounter: Payer: Self-pay | Admitting: Family Medicine

## 2015-11-16 ENCOUNTER — Ambulatory Visit (INDEPENDENT_AMBULATORY_CARE_PROVIDER_SITE_OTHER): Payer: PPO | Admitting: Family Medicine

## 2015-11-16 VITALS — BP 148/88 | Temp 98.8°F | Ht 71.0 in | Wt 177.4 lb

## 2015-11-16 DIAGNOSIS — J329 Chronic sinusitis, unspecified: Secondary | ICD-10-CM | POA: Diagnosis not present

## 2015-11-16 DIAGNOSIS — R21 Rash and other nonspecific skin eruption: Secondary | ICD-10-CM | POA: Diagnosis not present

## 2015-11-16 DIAGNOSIS — I10 Essential (primary) hypertension: Secondary | ICD-10-CM | POA: Diagnosis not present

## 2015-11-16 MED ORDER — AMOXICILLIN-POT CLAVULANATE 875-125 MG PO TABS: ORAL_TABLET | ORAL | Status: DC

## 2015-11-16 MED ORDER — METHYLPREDNISOLONE ACETATE 80 MG/ML IJ SUSP
80.0000 mg | Freq: Once | INTRAMUSCULAR | Status: DC
Start: 2015-11-16 — End: 2015-11-16

## 2015-11-16 MED ORDER — METHYLPREDNISOLONE ACETATE 40 MG/ML IJ SUSP
40.0000 mg | Freq: Once | INTRAMUSCULAR | Status: AC
  Administered 2015-11-16: 80 mg via INTRAMUSCULAR

## 2015-11-16 NOTE — Progress Notes (Signed)
   Subjective:    Patient ID: Kevin Sweeney, male    DOB: 07/04/1940, 75 y.o.   MRN: [id number] Patient patient arrives with several concerns. Sinusitis This is a new problem. The current episode started in the past 7 days. Associated symptoms include coughing, headaches and sinus pressure. (Runny nose, itching watery eyes,) Treatments tried: Allegra, Nasal Spray, Flonase.   Patient had mole removed on back and would like to ensure mole is healing properly. Very on back somewhat irritated. Patient wonders if infected.  Unfortunately continues to smoke tobacco. Generally VS cigar. Chest x-ray ordered a few years ago patient did not get it. Does report substantial smoking history down through the years.  Frontal headache cough congestion. Next  Concerned about blood pressure. Had to stop hydrochlorothiazide added recently. Maintain knee dose Norvasc. Still blood pressure numbers are elevated    Review of Systems  HENT: Positive for sinus pressure.   Respiratory: Positive for cough.   Neurological: Positive for headaches.       Objective:   Physical Exam Alert vital stable HET moderate nasal congestion frontal tenderness pharynx erythematous neck supple lungs diminished breath sounds diffusely heart rare rhythm back slightly inflamed biopsy site no frank infection   Blood pressure numbers reviewed. 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 Steroid injection per patient request. Blood pressure management discussed. Maintain same meds for now. Follow-up with Dr. Nicki Reaper next month bring more blood pressure results at that time [facility]

## 2015-12-15 DIAGNOSIS — I10 Essential (primary) hypertension: Secondary | ICD-10-CM | POA: Diagnosis not present

## 2015-12-16 LAB — BASIC METABOLIC PANEL
BUN / CREAT RATIO: 11 (ref 10–24)
BUN: 8 mg/dL (ref 8–27)
CHLORIDE: 93 mmol/L — AB (ref 96–106)
CO2: 23 mmol/L (ref 18–29)
CREATININE: 0.76 mg/dL (ref 0.76–1.27)
Calcium: 9 mg/dL (ref 8.6–10.2)
GFR calc Af Amer: 105 mL/min/{1.73_m2} (ref >59)
GFR calc non Af Amer: 91 mL/min/{1.73_m2} (ref >59)
GLUCOSE: 130 mg/dL — AB (ref 65–99)
Potassium: 4 mmol/L (ref 3.5–5.2)
SODIUM: 133 mmol/L — AB (ref 134–144)

## 2015-12-22 ENCOUNTER — Encounter: Payer: Self-pay | Admitting: Family Medicine

## 2015-12-22 ENCOUNTER — Ambulatory Visit (INDEPENDENT_AMBULATORY_CARE_PROVIDER_SITE_OTHER): Payer: PPO | Admitting: Family Medicine

## 2015-12-22 VITALS — BP 152/88 | Ht 71.0 in | Wt 178.8 lb

## 2015-12-22 DIAGNOSIS — E785 Hyperlipidemia, unspecified: Secondary | ICD-10-CM

## 2015-12-22 DIAGNOSIS — I10 Essential (primary) hypertension: Secondary | ICD-10-CM

## 2015-12-22 MED ORDER — AMLODIPINE BESYLATE 5 MG PO TABS: ORAL_TABLET | ORAL | Status: DC

## 2015-12-22 MED ORDER — PANTOPRAZOLE SODIUM 40 MG PO TBEC: 40.0000 mg | DELAYED_RELEASE_TABLET | Freq: Every day | ORAL | Status: DC

## 2015-12-22 MED ORDER — LISINOPRIL 20 MG PO TABS: 20.0000 mg | ORAL_TABLET | Freq: Every day | ORAL | Status: DC

## 2015-12-22 MED ORDER — DOXAZOSIN MESYLATE 4 MG PO TABS: ORAL_TABLET | ORAL | Status: DC

## 2015-12-22 NOTE — Progress Notes (Signed)
   Subjective:    Patient ID: Kevin Sweeney, male    DOB: [birth year], 74 y.o.   MRN: [id number]  Hypertension This is a chronic problem. The current episode started more than 1 year ago. Risk factors for coronary artery disease include dyslipidemia and male gender. Treatments tried: norvasc,lisinopril,cardura. There are no compliance problems.    Lingering sinus sx -using otc sudafed   Review of Systems Patient denies chest tightness pressure pain shortness breath nausea nausea vomiting    Objective:   Physical Exam  Lungs are clear hearts regular pulse normal BP mild elevation patient has history of whitecoat hypertension      Assessment & Plan:  Continue current medication Follow-up for wellness visit in the fall Lab work at that time Patient will call us Refills sent in Allergy medicine when necessary Patient to follow-up if ongoing respiratory respiratory illnesses We will discuss possible CT scan of chest low-dose when he follows up in the fall

## 2015-12-22 NOTE — Patient Instructions (Signed)
DASH Eating Plan  DASH stands for "Dietary Approaches to Stop Hypertension." The DASH eating plan is a healthy eating plan that has been shown to reduce high blood pressure (hypertension). Additional health benefits may include reducing the risk of type 2 diabetes mellitus, heart disease, and stroke. The DASH eating plan may also help with weight loss.  WHAT DO I NEED TO KNOW ABOUT THE DASH EATING PLAN?  For the DASH eating plan, you will follow these general guidelines:  · Choose foods with a percent daily value for sodium of less than 5% (as listed on the food label).  · Use salt-free seasonings or herbs instead of table salt or sea salt.  · Check with your health care provider or pharmacist before using salt substitutes.  · Eat lower-sodium products, often labeled as "lower sodium" or "no salt added."  · Eat fresh foods.  · Eat more vegetables, fruits, and low-fat dairy products.  · Choose whole grains. Look for the word "whole" as the first word in the ingredient list.  · Choose fish and skinless chicken or turkey more often than red meat. Limit fish, poultry, and meat to 6 oz (170 g) each day.  · Limit sweets, desserts, sugars, and sugary drinks.  · Choose heart-healthy fats.  · Limit cheese to 1 oz (28 g) per day.  · Eat more home-cooked food and less restaurant, buffet, and fast food.  · Limit fried foods.  · Cook foods using methods other than frying.  · Limit canned vegetables. If you do use them, rinse them well to decrease the sodium.  · When eating at a restaurant, ask that your food be prepared with less salt, or no salt if possible.  WHAT FOODS CAN I EAT?  Seek help from a dietitian for individual calorie needs.  Grains  Whole grain or whole wheat bread. Brown rice. Whole grain or whole wheat pasta. Quinoa, bulgur, and whole grain cereals. Low-sodium cereals. Corn or whole wheat flour tortillas. Whole grain cornbread. Whole grain crackers. Low-sodium crackers.  Vegetables  Fresh or frozen vegetables  (raw, steamed, roasted, or grilled). Low-sodium or reduced-sodium tomato and vegetable juices. Low-sodium or reduced-sodium tomato sauce and paste. Low-sodium or reduced-sodium canned vegetables.   Fruits  All fresh, canned (in natural juice), or frozen fruits.  Meat and Other Protein Products  Ground beef (85% or leaner), grass-fed beef, or beef trimmed of fat. Skinless chicken or turkey. Ground chicken or turkey. Pork trimmed of fat. All fish and seafood. Eggs. Dried beans, peas, or lentils. Unsalted nuts and seeds. Unsalted canned beans.  Dairy  Low-fat dairy products, such as skim or 1% milk, 2% or reduced-fat cheeses, low-fat ricotta or cottage cheese, or plain low-fat yogurt. Low-sodium or reduced-sodium cheeses.  Fats and Oils  Tub margarines without trans fats. Light or reduced-fat mayonnaise and salad dressings (reduced sodium). Avocado. Safflower, olive, or canola oils. Natural peanut or almond butter.  Other  Unsalted popcorn and pretzels.  The items listed above may not be a complete list of recommended foods or beverages. Contact your dietitian for more options.  WHAT FOODS ARE NOT RECOMMENDED?  Grains  White bread. White pasta. White rice. Refined cornbread. Bagels and croissants. Crackers that contain trans fat.  Vegetables  Creamed or fried vegetables. Vegetables in a cheese sauce. Regular canned vegetables. Regular canned tomato sauce and paste. Regular tomato and vegetable juices.  Fruits  Dried fruits. Canned fruit in light or heavy syrup. Fruit juice.  Meat and Other Protein   Products  Fatty cuts of meat. Ribs, chicken wings, bacon, sausage, bologna, salami, chitterlings, fatback, hot dogs, bratwurst, and packaged luncheon meats. Salted nuts and seeds. Canned beans with salt.  Dairy  Whole or 2% milk, cream, half-and-half, and cream cheese. Whole-fat or sweetened yogurt. Full-fat cheeses or blue cheese. Nondairy creamers and whipped toppings. Processed cheese, cheese spreads, or cheese  curds.  Condiments  Onion and garlic salt, seasoned salt, table salt, and sea salt. Canned and packaged gravies. Worcestershire sauce. Tartar sauce. Barbecue sauce. Teriyaki sauce. Soy sauce, including reduced sodium. Steak sauce. Fish sauce. Oyster sauce. Cocktail sauce. Horseradish. Ketchup and mustard. Meat flavorings and tenderizers. Bouillon cubes. Hot sauce. Tabasco sauce. Marinades. Taco seasonings. Relishes.  Fats and Oils  Butter, stick margarine, lard, shortening, ghee, and bacon fat. Coconut, palm kernel, or palm oils. Regular salad dressings.  Other  Pickles and olives. Salted popcorn and pretzels.  The items listed above may not be a complete list of foods and beverages to avoid. Contact your dietitian for more information.  WHERE CAN I FIND MORE INFORMATION?  [employer], [employer]: www.nhlbi.nih.gov/health/health-topics/topics/dash/     This information is not intended to replace advice given to you by your health care provider. Make sure you discuss any questions you have with your health care provider.     Document Released: 05/30/2011 Document Revised: 07/01/2014 Document Reviewed: 04/14/2013  Elsevier Interactive Patient Education ©2016 [employer]

## 2016-04-15 ENCOUNTER — Telehealth: Payer: Self-pay | Admitting: Family Medicine

## 2016-04-15 DIAGNOSIS — Z79899 Other long term (current) drug therapy: Secondary | ICD-10-CM

## 2016-04-15 DIAGNOSIS — E785 Hyperlipidemia, unspecified: Secondary | ICD-10-CM

## 2016-04-15 DIAGNOSIS — Z125 Encounter for screening for malignant neoplasm of prostate: Secondary | ICD-10-CM

## 2016-04-15 NOTE — Telephone Encounter (Signed)
Notified wife that blood work has been ordered.

## 2016-04-15 NOTE — Telephone Encounter (Signed)
Requesting blood work for upcoming appointment this week.

## 2016-04-15 NOTE — Telephone Encounter (Signed)
Lipid, liver, metabolic 7, PSA 

## 2016-04-17 DIAGNOSIS — Z79899 Other long term (current) drug therapy: Secondary | ICD-10-CM | POA: Diagnosis not present

## 2016-04-17 DIAGNOSIS — E785 Hyperlipidemia, unspecified: Secondary | ICD-10-CM | POA: Diagnosis not present

## 2016-04-17 DIAGNOSIS — Z125 Encounter for screening for malignant neoplasm of prostate: Secondary | ICD-10-CM | POA: Diagnosis not present

## 2016-04-18 LAB — LIPID PANEL
CHOLESTEROL TOTAL: 127 mg/dL (ref 100–199)
Chol/HDL Ratio: 2.4 ratio units (ref 0.0–5.0)
HDL: 52 mg/dL (ref >39)
LDL CALC: 62 mg/dL (ref 0–99)
Triglycerides: 64 mg/dL (ref 0–149)
VLDL CHOLESTEROL CAL: 13 mg/dL (ref 5–40)

## 2016-04-18 LAB — BASIC METABOLIC PANEL
BUN/Creatinine Ratio: 16 (ref 10–24)
BUN: 10 mg/dL (ref 8–27)
CALCIUM: 9.3 mg/dL (ref 8.6–10.2)
CHLORIDE: 100 mmol/L (ref 96–106)
CO2: 25 mmol/L (ref 18–29)
Creatinine, Ser: 0.63 mg/dL — ABNORMAL LOW (ref 0.76–1.27)
GFR calc Af Amer: 113 mL/min/{1.73_m2} (ref >59)
GFR, EST NON AFRICAN AMERICAN: 98 mL/min/{1.73_m2} (ref >59)
Glucose: 96 mg/dL (ref 65–99)
POTASSIUM: 4.2 mmol/L (ref 3.5–5.2)
Sodium: 141 mmol/L (ref 134–144)

## 2016-04-18 LAB — HEPATIC FUNCTION PANEL
ALBUMIN: 4 g/dL (ref 3.5–4.8)
ALT: 11 IU/L (ref 0–44)
AST: 13 IU/L (ref 0–40)
Alkaline Phosphatase: 72 IU/L (ref 39–117)
Bilirubin Total: 0.5 mg/dL (ref 0.0–1.2)
Bilirubin, Direct: 0.18 mg/dL (ref 0.00–0.40)
TOTAL PROTEIN: 6.3 g/dL (ref 6.0–8.5)

## 2016-04-18 LAB — PSA: PROSTATE SPECIFIC AG, SERUM: 1.7 ng/mL (ref 0.0–4.0)

## 2016-04-22 ENCOUNTER — Ambulatory Visit (INDEPENDENT_AMBULATORY_CARE_PROVIDER_SITE_OTHER): Payer: PPO | Admitting: Family Medicine

## 2016-04-22 ENCOUNTER — Encounter: Payer: Self-pay | Admitting: Family Medicine

## 2016-04-22 VITALS — BP 168/84 | Ht 71.0 in | Wt 188.8 lb

## 2016-04-22 DIAGNOSIS — I10 Essential (primary) hypertension: Secondary | ICD-10-CM | POA: Diagnosis not present

## 2016-04-22 DIAGNOSIS — K219 Gastro-esophageal reflux disease without esophagitis: Secondary | ICD-10-CM | POA: Diagnosis not present

## 2016-04-22 DIAGNOSIS — E785 Hyperlipidemia, unspecified: Secondary | ICD-10-CM | POA: Diagnosis not present

## 2016-04-22 DIAGNOSIS — J301 Allergic rhinitis due to pollen: Secondary | ICD-10-CM | POA: Diagnosis not present

## 2016-04-22 DIAGNOSIS — G4700 Insomnia, unspecified: Secondary | ICD-10-CM | POA: Diagnosis not present

## 2016-04-22 MED ORDER — LISINOPRIL 20 MG PO TABS: 20.0000 mg | ORAL_TABLET | Freq: Every day | ORAL | 1 refills | Status: DC

## 2016-04-22 MED ORDER — ALPRAZOLAM 0.5 MG PO TABS: 0.5000 mg | ORAL_TABLET | Freq: Every evening | ORAL | 5 refills | Status: DC | PRN

## 2016-04-22 MED ORDER — DOXAZOSIN MESYLATE 4 MG PO TABS: ORAL_TABLET | ORAL | 1 refills | Status: DC

## 2016-04-22 MED ORDER — PANTOPRAZOLE SODIUM 40 MG PO TBEC: 40.0000 mg | DELAYED_RELEASE_TABLET | Freq: Every day | ORAL | 1 refills | Status: DC

## 2016-04-22 MED ORDER — AMLODIPINE BESYLATE 5 MG PO TABS: ORAL_TABLET | ORAL | 1 refills | Status: DC

## 2016-04-22 NOTE — Progress Notes (Signed)
   Subjective:    Patient ID: Kevin Sweeney, male    DOB: 24-May-1941, 75 y.o.   MRN: [id number]  Hypertension  This is a chronic problem. The current episode started more than 1 year ago. Risk factors for coronary artery disease include male gender. Treatments tried: norvasc, cardura, lisinopril. There are no compliance problems.   BP high-under tremendous stress  Patient does have element of office hypertension He does try to eat healthy He quit smoking 4 months ago Hyperlipidemia takes his medicine labs were reviewed with him today denies any intolerance to the medicine Reflux under good control with medication Has significant insomnia associated with the stress of his wife having advanced cancer Allergies under good control with medications.  Review of Systems Patient denies any chest tightness pressure pain cough wheezing vomiting denies rectal bleeding denies hematuria    Objective:   Physical Exam Lungs are clear hearts regular pulse normal blood pressure elevated on our reading but multiple readings from home looked good this patient does have element of office hypertension abdomen no tenderness extremities no edema       Assessment & Plan:  Blood pressure his numbers at home looked good. Continue on current measures. Patient does have element of whitecoat hypertension  Hyperlipidemia previous labs reviewed patient taking medicine continue this  Patient did quit smoking several months ago which is good  Allergies stable on OTC measures  Acid reflux stable on current medication  Patient in follow-up in 6 months  Under a lot of stress difficulty sleeping at night Xanax when necessary cautioned drowsiness home use only

## 2016-10-21 ENCOUNTER — Ambulatory Visit: Payer: PPO | Admitting: Family Medicine

## 2016-10-28 ENCOUNTER — Other Ambulatory Visit: Payer: Self-pay | Admitting: Family Medicine

## 2016-10-29 NOTE — Telephone Encounter (Signed)
May have this +2 refills will need follow-up office visit

## 2016-11-12 ENCOUNTER — Telehealth: Payer: Self-pay | Admitting: Family Medicine

## 2016-11-12 DIAGNOSIS — E785 Hyperlipidemia, unspecified: Secondary | ICD-10-CM

## 2016-11-12 DIAGNOSIS — I10 Essential (primary) hypertension: Secondary | ICD-10-CM

## 2016-11-12 NOTE — Telephone Encounter (Signed)
Pt is requesting lab orders to be sent over for an upcoming appt. Last labs per epic were: psa,bmp,hepatic,and lipid on 04/17/2016

## 2016-11-12 NOTE — Telephone Encounter (Signed)
Blood work ordered in EPIC. Patient notified. 

## 2016-11-12 NOTE — Telephone Encounter (Signed)
Lipid, liver, metabolic 7 

## 2016-11-14 DIAGNOSIS — E785 Hyperlipidemia, unspecified: Secondary | ICD-10-CM | POA: Diagnosis not present

## 2016-11-14 DIAGNOSIS — I10 Essential (primary) hypertension: Secondary | ICD-10-CM | POA: Diagnosis not present

## 2016-11-15 LAB — HEPATIC FUNCTION PANEL
ALK PHOS: 87 IU/L (ref 39–117)
ALT: 5 IU/L (ref 0–44)
AST: 10 IU/L (ref 0–40)
Albumin: 3.6 g/dL (ref 3.5–4.8)
BILIRUBIN, DIRECT: 0.21 mg/dL (ref 0.00–0.40)
Bilirubin Total: 0.5 mg/dL (ref 0.0–1.2)
Total Protein: 6.2 g/dL (ref 6.0–8.5)

## 2016-11-15 LAB — BASIC METABOLIC PANEL
BUN / CREAT RATIO: 9 — AB (ref 10–24)
BUN: 8 mg/dL (ref 8–27)
CHLORIDE: 100 mmol/L (ref 96–106)
CO2: 29 mmol/L (ref 18–29)
CREATININE: 0.86 mg/dL (ref 0.76–1.27)
Calcium: 9 mg/dL (ref 8.6–10.2)
GFR calc Af Amer: 99 mL/min/{1.73_m2} (ref >59)
GFR calc non Af Amer: 86 mL/min/{1.73_m2} (ref >59)
GLUCOSE: 90 mg/dL (ref 65–99)
Potassium: 3.1 mmol/L — ABNORMAL LOW (ref 3.5–5.2)
SODIUM: 146 mmol/L — AB (ref 134–144)

## 2016-11-15 LAB — LIPID PANEL
CHOLESTEROL TOTAL: 126 mg/dL (ref 100–199)
Chol/HDL Ratio: 2.7 ratio (ref 0.0–5.0)
HDL: 46 mg/dL (ref >39)
LDL Calculated: 62 mg/dL (ref 0–99)
Triglycerides: 91 mg/dL (ref 0–149)
VLDL Cholesterol Cal: 18 mg/dL (ref 5–40)

## 2016-11-20 ENCOUNTER — Encounter: Payer: Self-pay | Admitting: Family Medicine

## 2016-11-20 ENCOUNTER — Ambulatory Visit (INDEPENDENT_AMBULATORY_CARE_PROVIDER_SITE_OTHER): Payer: PPO | Admitting: Family Medicine

## 2016-11-20 VITALS — BP 144/80 | Ht 71.0 in | Wt 178.0 lb

## 2016-11-20 DIAGNOSIS — E785 Hyperlipidemia, unspecified: Secondary | ICD-10-CM

## 2016-11-20 DIAGNOSIS — K219 Gastro-esophageal reflux disease without esophagitis: Secondary | ICD-10-CM | POA: Diagnosis not present

## 2016-11-20 DIAGNOSIS — E876 Hypokalemia: Secondary | ICD-10-CM | POA: Diagnosis not present

## 2016-11-20 DIAGNOSIS — I10 Essential (primary) hypertension: Secondary | ICD-10-CM

## 2016-11-20 MED ORDER — POTASSIUM CHLORIDE ER 10 MEQ PO TBCR: 10.0000 meq | EXTENDED_RELEASE_TABLET | Freq: Two times a day (BID) | ORAL | 0 refills | Status: DC

## 2016-11-20 MED ORDER — LISINOPRIL 20 MG PO TABS: 20.0000 mg | ORAL_TABLET | Freq: Every day | ORAL | 1 refills | Status: DC

## 2016-11-20 MED ORDER — AMLODIPINE BESYLATE 5 MG PO TABS: ORAL_TABLET | ORAL | 1 refills | Status: DC

## 2016-11-20 MED ORDER — PRAVASTATIN SODIUM 80 MG PO TABS: 80.0000 mg | ORAL_TABLET | Freq: Every day | ORAL | 3 refills | Status: DC

## 2016-11-20 MED ORDER — DOXAZOSIN MESYLATE 4 MG PO TABS: ORAL_TABLET | ORAL | 1 refills | Status: DC

## 2016-11-20 MED ORDER — PANTOPRAZOLE SODIUM 40 MG PO TBEC: 40.0000 mg | DELAYED_RELEASE_TABLET | Freq: Every day | ORAL | 1 refills | Status: DC

## 2016-11-20 NOTE — Patient Instructions (Signed)
Hypokalemia Hypokalemia means that the amount of potassium in the blood is lower than normal.Potassium is a chemical that helps regulate the amount of fluid in the body (electrolyte). It also stimulates muscle tightening (contraction) and helps nerves work properly.Normally, most of the body's potassium is inside of cells, and only a very small amount is in the blood. Because the amount in the blood is so small, minor changes to potassium levels in the blood can be life-threatening. What are the causes? This condition may be caused by:  Antibiotic medicine.  Diarrhea or vomiting. Taking too much of a medicine that helps you have a bowel movement (laxative) can cause diarrhea and lead to hypokalemia.  Chronic kidney disease (CKD).  Medicines that help the body get rid of excess fluid (diuretics).  Eating disorders, such as bulimia.  Low magnesium levels in the body.  Sweating a lot. What are the signs or symptoms? Symptoms of this condition include:  Weakness.  Constipation.  Fatigue.  Muscle cramps.  Mental confusion.  Skipped heartbeats or irregular heartbeat (palpitations).  Tingling or numbness. How is this diagnosed? This condition is diagnosed with a blood test. How is this treated? Hypokalemia can be treated by taking potassium supplements by mouth or adjusting the medicines that you take. Treatment may also include eating more foods that contain a lot of potassium. If your potassium level is very low, you may need to get potassium through an IV tube in one of your veins and be monitored in the hospital. Follow these instructions at home:  Take over-the-counter and prescription medicines only as told by your health care provider. This includes vitamins and supplements.  Eat a healthy diet. A healthy diet includes fresh fruits and vegetables, whole grains, healthy fats, and lean proteins.  If instructed, eat more foods that contain a lot of potassium, such  as:  Nuts, such as peanuts and pistachios.  Seeds, such as sunflower seeds and pumpkin seeds.  Peas, lentils, and lima beans.  Whole grain and bran cereals and breads.  Fresh fruits and vegetables, such as apricots, avocado, bananas, cantaloupe, kiwi, oranges, tomatoes, asparagus, and potatoes.  Orange juice.  Tomato juice.  Red meats.  Yogurt.  Keep all follow-up visits as told by your health care provider. This is important. Contact a health care provider if:  You have weakness that gets worse.  You feel your heart pounding or racing.  You vomit.  You have diarrhea.  You have diabetes (diabetes mellitus) and you have trouble keeping your blood sugar (glucose) in your target range. Get help right away if:  You have chest pain.  You have shortness of breath.  You have vomiting or diarrhea that lasts for more than 2 days.  You faint. This information is not intended to replace advice given to you by your health care provider. Make sure you discuss any questions you have with your health care provider. Document Released: 06/10/2005 Document Revised: 01/27/2016 Document Reviewed: 01/27/2016 Elsevier Interactive Patient Education  2017 Elsevier Inc.  

## 2016-11-20 NOTE — Progress Notes (Signed)
   Subjective:    Patient ID: Kevin Sweeney, male    DOB: 1940-10-14, 76 y.o.   MRN: [id number]  Hyperlipidemia  This is a chronic problem. The current episode started more than 1 year ago. Pertinent negatives include no chest pain or shortness of breath. There are no compliance problems.    Pt states no concerns today.  Patient does take his cholesterol medicine denies missing it watch his diet Takes blood pressure medicine watch his salt diet No longer smokes States not having any reflux issues as well as he takes his medicine Allergies under good control with his medications Under a lot of stress with his wife's cancer but patient denies being depressed  Review of Systems  Constitutional: Negative for activity change, fatigue and fever.  Respiratory: Negative for cough and shortness of breath.   Cardiovascular: Negative for chest pain and leg swelling.  Neurological: Negative for headaches.       Objective:   Physical Exam  Constitutional: He appears well-nourished. No distress.  Cardiovascular: Normal rate, regular rhythm and normal heart sounds.   No murmur heard. Pulmonary/Chest: Effort normal and breath sounds normal. No respiratory distress.  Musculoskeletal: He exhibits no edema.  Lymphadenopathy:    He has no cervical adenopathy.  Neurological: He is alert.  Psychiatric: His behavior is normal.  Vitals reviewed.    25 minutes was spent with the patient. Greater than half the time was spent in discussion and answering questions and counseling regarding the issues that the patient came in for today.      Assessment & Plan:  Hyperlipidemia previous labs reviewed continue medication watch diet Reflux good control continue acid blocker taking one daily Blood pressure good control watch salt diet exercise continue medication Allergies overall doing well with medications The patient does need metabolic 7

## 2016-12-31 DIAGNOSIS — E876 Hypokalemia: Secondary | ICD-10-CM | POA: Diagnosis not present

## 2017-01-01 LAB — BASIC METABOLIC PANEL
BUN/Creatinine Ratio: 10 (ref 10–24)
BUN: 9 mg/dL (ref 8–27)
CALCIUM: 9.1 mg/dL (ref 8.6–10.2)
CHLORIDE: 100 mmol/L (ref 96–106)
CO2: 25 mmol/L (ref 20–29)
Creatinine, Ser: 0.87 mg/dL (ref 0.76–1.27)
GFR, EST AFRICAN AMERICAN: 99 mL/min/{1.73_m2} (ref >59)
GFR, EST NON AFRICAN AMERICAN: 86 mL/min/{1.73_m2} (ref >59)
Glucose: 88 mg/dL (ref 65–99)
POTASSIUM: 4 mmol/L (ref 3.5–5.2)
Sodium: 142 mmol/L (ref 134–144)

## 2017-04-15 ENCOUNTER — Ambulatory Visit (INDEPENDENT_AMBULATORY_CARE_PROVIDER_SITE_OTHER): Payer: PPO | Admitting: *Deleted

## 2017-04-15 DIAGNOSIS — Z23 Encounter for immunization: Secondary | ICD-10-CM | POA: Diagnosis not present

## 2017-04-23 ENCOUNTER — Ambulatory Visit: Payer: PPO

## 2017-05-16 ENCOUNTER — Telehealth: Payer: Self-pay | Admitting: Family Medicine

## 2017-05-16 DIAGNOSIS — Z125 Encounter for screening for malignant neoplasm of prostate: Secondary | ICD-10-CM

## 2017-05-16 DIAGNOSIS — R5383 Other fatigue: Secondary | ICD-10-CM

## 2017-05-16 DIAGNOSIS — I10 Essential (primary) hypertension: Secondary | ICD-10-CM

## 2017-05-16 DIAGNOSIS — E785 Hyperlipidemia, unspecified: Secondary | ICD-10-CM

## 2017-05-16 NOTE — Telephone Encounter (Signed)
Lipid, liver, metabolic 7, CBC, PSA-hyperlipidemia hypertension, screening

## 2017-05-16 NOTE — Telephone Encounter (Signed)
Patient had Lipid, Liver and Met 7 10/2016

## 2017-05-16 NOTE — Telephone Encounter (Signed)
Pt had 6 month follow up appt 06/09/17 - will need lab work  Please call when orders are ready

## 2017-05-16 NOTE — Telephone Encounter (Signed)
Blood work ordered in Epic. Patient notified. 

## 2017-05-22 ENCOUNTER — Ambulatory Visit: Payer: PPO | Admitting: Family Medicine

## 2017-05-23 ENCOUNTER — Ambulatory Visit: Payer: PPO | Admitting: Family Medicine

## 2017-05-30 DIAGNOSIS — Z125 Encounter for screening for malignant neoplasm of prostate: Secondary | ICD-10-CM | POA: Diagnosis not present

## 2017-05-30 DIAGNOSIS — E785 Hyperlipidemia, unspecified: Secondary | ICD-10-CM | POA: Diagnosis not present

## 2017-05-30 DIAGNOSIS — I10 Essential (primary) hypertension: Secondary | ICD-10-CM | POA: Diagnosis not present

## 2017-05-30 DIAGNOSIS — R5383 Other fatigue: Secondary | ICD-10-CM | POA: Diagnosis not present

## 2017-05-31 LAB — LIPID PANEL
CHOL/HDL RATIO: 2.7 ratio (ref 0.0–5.0)
Cholesterol, Total: 137 mg/dL (ref 100–199)
HDL: 50 mg/dL (ref >39)
LDL CALC: 70 mg/dL (ref 0–99)
TRIGLYCERIDES: 87 mg/dL (ref 0–149)
VLDL Cholesterol Cal: 17 mg/dL (ref 5–40)

## 2017-05-31 LAB — BASIC METABOLIC PANEL
BUN / CREAT RATIO: 10 (ref 10–24)
BUN: 10 mg/dL (ref 8–27)
CALCIUM: 9 mg/dL (ref 8.6–10.2)
CO2: 28 mmol/L (ref 20–29)
Chloride: 98 mmol/L (ref 96–106)
Creatinine, Ser: 0.99 mg/dL (ref 0.76–1.27)
GFR calc Af Amer: 86 mL/min/{1.73_m2} (ref >59)
GFR calc non Af Amer: 75 mL/min/{1.73_m2} (ref >59)
Glucose: 92 mg/dL (ref 65–99)
POTASSIUM: 3.4 mmol/L — AB (ref 3.5–5.2)
Sodium: 142 mmol/L (ref 134–144)

## 2017-05-31 LAB — CBC WITH DIFFERENTIAL/PLATELET
Basophils Absolute: 0 10*3/uL (ref 0.0–0.2)
Basos: 0 %
EOS (ABSOLUTE): 0.1 10*3/uL (ref 0.0–0.4)
Eos: 1 %
HEMOGLOBIN: 13.1 g/dL (ref 13.0–17.7)
Hematocrit: 40.7 % (ref 37.5–51.0)
Immature Grans (Abs): 0 10*3/uL (ref 0.0–0.1)
Immature Granulocytes: 0 %
LYMPHS ABS: 1.4 10*3/uL (ref 0.7–3.1)
LYMPHS: 22 %
MCH: 30 pg (ref 26.6–33.0)
MCHC: 32.2 g/dL (ref 31.5–35.7)
MCV: 93 fL (ref 79–97)
MONOCYTES: 6 %
Monocytes Absolute: 0.4 10*3/uL (ref 0.1–0.9)
Neutrophils Absolute: 4.7 10*3/uL (ref 1.4–7.0)
Neutrophils: 71 %
PLATELETS: 257 10*3/uL (ref 150–379)
RBC: 4.36 x10E6/uL (ref 4.14–5.80)
RDW: 13.5 % (ref 12.3–15.4)
WBC: 6.6 10*3/uL (ref 3.4–10.8)

## 2017-05-31 LAB — HEPATIC FUNCTION PANEL
ALT: 7 IU/L (ref 0–44)
AST: 11 IU/L (ref 0–40)
Albumin: 3.7 g/dL (ref 3.5–4.8)
Alkaline Phosphatase: 74 IU/L (ref 39–117)
Bilirubin Total: 0.4 mg/dL (ref 0.0–1.2)
Bilirubin, Direct: 0.17 mg/dL (ref 0.00–0.40)
Total Protein: 6.4 g/dL (ref 6.0–8.5)

## 2017-05-31 LAB — PSA: PROSTATE SPECIFIC AG, SERUM: 2.2 ng/mL (ref 0.0–4.0)

## 2017-06-09 ENCOUNTER — Ambulatory Visit: Payer: PPO | Admitting: Family Medicine

## 2017-06-20 ENCOUNTER — Encounter: Payer: Self-pay | Admitting: Family Medicine

## 2017-06-20 ENCOUNTER — Ambulatory Visit (INDEPENDENT_AMBULATORY_CARE_PROVIDER_SITE_OTHER): Payer: PPO | Admitting: Family Medicine

## 2017-06-20 VITALS — BP 136/86 | Ht 71.0 in | Wt 171.2 lb

## 2017-06-20 DIAGNOSIS — G4700 Insomnia, unspecified: Secondary | ICD-10-CM | POA: Diagnosis not present

## 2017-06-20 DIAGNOSIS — E785 Hyperlipidemia, unspecified: Secondary | ICD-10-CM | POA: Diagnosis not present

## 2017-06-20 DIAGNOSIS — Z1211 Encounter for screening for malignant neoplasm of colon: Secondary | ICD-10-CM | POA: Diagnosis not present

## 2017-06-20 DIAGNOSIS — I10 Essential (primary) hypertension: Secondary | ICD-10-CM

## 2017-06-20 DIAGNOSIS — K219 Gastro-esophageal reflux disease without esophagitis: Secondary | ICD-10-CM | POA: Diagnosis not present

## 2017-06-20 MED ORDER — ALPRAZOLAM 0.5 MG PO TABS: ORAL_TABLET | ORAL | 5 refills | Status: DC

## 2017-06-20 MED ORDER — DOXAZOSIN MESYLATE 4 MG PO TABS: ORAL_TABLET | ORAL | 1 refills | Status: DC

## 2017-06-20 MED ORDER — PANTOPRAZOLE SODIUM 40 MG PO TBEC: 40.0000 mg | DELAYED_RELEASE_TABLET | Freq: Every day | ORAL | 1 refills | Status: DC

## 2017-06-20 MED ORDER — AMLODIPINE BESYLATE 5 MG PO TABS: ORAL_TABLET | ORAL | 1 refills | Status: DC

## 2017-06-20 MED ORDER — LISINOPRIL 20 MG PO TABS: 20.0000 mg | ORAL_TABLET | Freq: Every day | ORAL | 1 refills | Status: DC

## 2017-06-20 MED ORDER — PRAVASTATIN SODIUM 80 MG PO TABS: 80.0000 mg | ORAL_TABLET | Freq: Every day | ORAL | 3 refills | Status: DC

## 2017-06-20 NOTE — Progress Notes (Signed)
   Subjective:    Patient ID: Kevin Sweeney, male    DOB: 08/11/1940, 76 y.o.   MRN: [id number]  Hyperlipidemia  This is a new problem. The current episode started more than 1 year ago. Pertinent negatives include no chest pain or shortness of breath. Treatments tried: pravachol. Risk factors for coronary artery disease include dyslipidemia.   Patient uses Xanax at nighttime to help with sleep he's under a lot of stress denies being depressed his wife is dying of cancer He does take his blood pressure medicine on a regular basis watches salt in his diet is not smoking Uses Adoxa Zosyn at nighttime which helps him with his BPH symptoms. He denies any chest tightness pressure pain He does get reflux issues takes his medicine does good job with this Denies rectal bleeding   Review of Systems  Constitutional: Negative for activity change, fatigue and fever.  Respiratory: Negative for cough and shortness of breath.   Cardiovascular: Negative for chest pain and leg swelling.  Neurological: Negative for headaches.       Objective:   Physical Exam  Constitutional: He appears well-nourished. No distress.  Cardiovascular: Normal rate, regular rhythm and normal heart sounds.  No murmur heard. Pulmonary/Chest: Effort normal and breath sounds normal. No respiratory distress.  Musculoskeletal: He exhibits no edema.  Lymphadenopathy:    He has no cervical adenopathy.  Neurological: He is alert.  Psychiatric: His behavior is normal.  Vitals reviewed.         Assessment & Plan:  The patient was seen today as part of an evaluation regarding hyperlipidemia. Recent lab work has been reviewed with the patient as well as the goals for good cholesterol care. In addition to this medications have been discussed the importance of compliance with diet and medications discussed as well. Patient has been informed of potential side effects of medications in the importance to notify us should any problems  occur. Finally the patient is aware that poor control of cholesterol, noncompliance can dramatically increase her risk of heart attack strokes and premature death. The patient will keep regular office visits and the patient does agreed to periodic lab work.  HTN- Patient was seen today as part of a visit regarding hypertension. The importance of healthy diet and regular physical activity was discussed. The importance of compliance with medications discussed. Ideal goal is to keep blood pressure low elevated levels certainly below 789/38 when possible. The patient was counseled that keeping blood pressure under control lessen his risk of heart attack, stroke, kidney failure, and early death. The importance of regular follow-ups was discussed with the patient. Low-salt diet such as DASH recommended. Regular physical activity was recommended as well. Patient was advised to keep regular follow-ups.  Screening for colon cancer patient does not want colonoscopy will do stool test  Reflux under good control with medication  Insomnia uses medicine intermittently  Under a lot of stress with his wife dying of cancer but he is handling it well  25 minutes was spent with the patient. Greater than half the time was spent in discussion and answering questions and counseling regarding the issues that the patient came in for today.

## 2017-09-08 ENCOUNTER — Ambulatory Visit (INDEPENDENT_AMBULATORY_CARE_PROVIDER_SITE_OTHER): Payer: PPO | Admitting: Family Medicine

## 2017-09-08 ENCOUNTER — Encounter: Payer: Self-pay | Admitting: Family Medicine

## 2017-09-08 VITALS — BP 136/86 | Temp 97.6°F | Ht 71.0 in | Wt 168.8 lb

## 2017-09-08 DIAGNOSIS — J019 Acute sinusitis, unspecified: Secondary | ICD-10-CM | POA: Diagnosis not present

## 2017-09-08 DIAGNOSIS — J309 Allergic rhinitis, unspecified: Secondary | ICD-10-CM | POA: Diagnosis not present

## 2017-09-08 MED ORDER — AMOXICILLIN 500 MG PO TABS: 500.0000 mg | ORAL_TABLET | Freq: Three times a day (TID) | ORAL | 0 refills | Status: DC

## 2017-09-08 MED ORDER — METHYLPREDNISOLONE ACETATE 40 MG/ML IJ SUSP
40.0000 mg | Freq: Once | INTRAMUSCULAR | Status: AC
  Administered 2017-09-08: 40 mg via INTRAMUSCULAR

## 2017-09-08 NOTE — Progress Notes (Signed)
   Subjective:    Patient ID: Kevin Sweeney, male    DOB: Jan 07, 1941, 77 y.o.   MRN: [id number]  Sinusitis  This is a new problem. The current episode started in the past 7 days. There has been no fever. Associated symptoms include congestion, coughing and a sore throat. Pertinent negatives include no chills or ear pain. (Runny nose, watery eyes) Past treatments include saline sprays (Afrin, flonase, sudafed PE). The treatment provided mild relief.   Significant head congestion drainage coughing   Review of Systems  Constitutional: Negative for activity change, chills and fever.  HENT: Positive for congestion, rhinorrhea and sore throat. Negative for ear pain.   Eyes: Negative for discharge.  Respiratory: Positive for cough. Negative for wheezing.   Cardiovascular: Negative for chest pain.  Gastrointestinal: Negative for nausea and vomiting.  Musculoskeletal: Negative for arthralgias.       Objective:   Physical Exam  Constitutional: He appears well-developed.  HENT:  Head: Normocephalic and atraumatic.  Mouth/Throat: Oropharynx is clear and moist. No oropharyngeal exudate.  Eyes: Right eye exhibits no discharge. Left eye exhibits no discharge.  Neck: Normal range of motion.  Cardiovascular: Normal rate, regular rhythm and normal heart sounds.  No murmur heard. Pulmonary/Chest: Effort normal and breath sounds normal. No respiratory distress. He has no wheezes. He has no rales.  Lymphadenopathy:    He has no cervical adenopathy.  Neurological: He exhibits normal muscle tone.  Skin: Skin is warm and dry.  Nursing note and vitals reviewed.   More than likely also has allergic rhinitis we talked about what to do about that as well      Assessment & Plan:  Patient was seen today for upper respiratory illness. It is felt that the patient is dealing with sinusitis. Antibiotics were prescribed today. Importance of compliance with medication was discussed. Symptoms should gradually  resolve over the course of the next several days. If high fevers, progressive illness, difficulty breathing, worsening condition or failure for symptoms to improve over the next several days then the patient is to follow-up. If any emergent conditions the patient is to follow-up in the emergency department otherwise to follow-up in the office.

## 2017-09-15 ENCOUNTER — Telehealth: Payer: Self-pay | Admitting: Family Medicine

## 2017-09-15 ENCOUNTER — Other Ambulatory Visit: Payer: Self-pay | Admitting: *Deleted

## 2017-09-15 MED ORDER — FLUTICASONE PROPIONATE 50 MCG/ACT NA SUSP: NASAL | 5 refills | Status: DC

## 2017-09-15 NOTE — Telephone Encounter (Signed)
Refills sent to pharm. Pt notified.  

## 2017-09-15 NOTE — Telephone Encounter (Signed)
Seen 09/08/17 for sinusitis. Last time flonase was sent in was 2016. Can he have a refill on flonase?

## 2017-09-15 NOTE — Telephone Encounter (Signed)
Sure plus five ref

## 2017-09-15 NOTE — Telephone Encounter (Signed)
Patient was here last week and saw Dr. Nicki Reaper and was given a written rx for an antibiotic to hold on to, but said Dr. Nicki Reaper was going to also refill his Flonase but when he went to [facility] in [place] they didn't have it. Would like a refill sent in. Pt # [phone]

## 2017-09-29 ENCOUNTER — Ambulatory Visit: Payer: PPO | Admitting: Family Medicine

## 2017-12-08 ENCOUNTER — Telehealth: Payer: Self-pay | Admitting: Family Medicine

## 2017-12-08 DIAGNOSIS — Z79899 Other long term (current) drug therapy: Secondary | ICD-10-CM

## 2017-12-08 DIAGNOSIS — I10 Essential (primary) hypertension: Secondary | ICD-10-CM

## 2017-12-08 DIAGNOSIS — E785 Hyperlipidemia, unspecified: Secondary | ICD-10-CM

## 2017-12-08 NOTE — Telephone Encounter (Signed)
Patient is aware 

## 2017-12-08 NOTE — Telephone Encounter (Signed)
Patient is requesting labs for appointment on 7/19 for 6 month follow up.

## 2017-12-08 NOTE — Telephone Encounter (Signed)
Last had drawn 05/30/2017 PSA,Cbc,Bmet,hepatic fx,lipid

## 2017-12-08 NOTE — Telephone Encounter (Signed)
Metabolic 7, lipid liver

## 2017-12-22 ENCOUNTER — Other Ambulatory Visit: Payer: Self-pay | Admitting: Family Medicine

## 2018-01-06 DIAGNOSIS — Z79899 Other long term (current) drug therapy: Secondary | ICD-10-CM | POA: Diagnosis not present

## 2018-01-06 DIAGNOSIS — I10 Essential (primary) hypertension: Secondary | ICD-10-CM | POA: Diagnosis not present

## 2018-01-06 DIAGNOSIS — E785 Hyperlipidemia, unspecified: Secondary | ICD-10-CM | POA: Diagnosis not present

## 2018-01-07 ENCOUNTER — Other Ambulatory Visit: Payer: Self-pay | Admitting: Family Medicine

## 2018-01-07 LAB — LIPID PANEL
CHOLESTEROL TOTAL: 143 mg/dL (ref 100–199)
Chol/HDL Ratio: 2.1 ratio (ref 0.0–5.0)
HDL: 69 mg/dL (ref >39)
LDL CALC: 58 mg/dL (ref 0–99)
Triglycerides: 78 mg/dL (ref 0–149)
VLDL CHOLESTEROL CAL: 16 mg/dL (ref 5–40)

## 2018-01-07 LAB — BASIC METABOLIC PANEL
BUN/Creatinine Ratio: 8 — ABNORMAL LOW (ref 10–24)
BUN: 7 mg/dL — AB (ref 8–27)
CHLORIDE: 94 mmol/L — AB (ref 96–106)
CO2: 25 mmol/L (ref 20–29)
CREATININE: 0.92 mg/dL (ref 0.76–1.27)
Calcium: 9.4 mg/dL (ref 8.6–10.2)
GFR calc Af Amer: 94 mL/min/{1.73_m2} (ref >59)
GFR calc non Af Amer: 82 mL/min/{1.73_m2} (ref >59)
GLUCOSE: 94 mg/dL (ref 65–99)
Potassium: 4.4 mmol/L (ref 3.5–5.2)
SODIUM: 136 mmol/L (ref 134–144)

## 2018-01-07 LAB — HEPATIC FUNCTION PANEL
ALBUMIN: 4.3 g/dL (ref 3.5–4.8)
ALK PHOS: 65 IU/L (ref 39–117)
ALT: 11 IU/L (ref 0–44)
AST: 13 IU/L (ref 0–40)
Bilirubin Total: 0.5 mg/dL (ref 0.0–1.2)
Bilirubin, Direct: 0.19 mg/dL (ref 0.00–0.40)
Total Protein: 6.6 g/dL (ref 6.0–8.5)

## 2018-01-09 ENCOUNTER — Ambulatory Visit (INDEPENDENT_AMBULATORY_CARE_PROVIDER_SITE_OTHER): Payer: PPO | Admitting: Family Medicine

## 2018-01-09 ENCOUNTER — Encounter: Payer: Self-pay | Admitting: Family Medicine

## 2018-01-09 VITALS — BP 142/82 | Ht 71.0 in | Wt 161.0 lb

## 2018-01-09 DIAGNOSIS — J301 Allergic rhinitis due to pollen: Secondary | ICD-10-CM

## 2018-01-09 DIAGNOSIS — K219 Gastro-esophageal reflux disease without esophagitis: Secondary | ICD-10-CM | POA: Diagnosis not present

## 2018-01-09 DIAGNOSIS — Z1211 Encounter for screening for malignant neoplasm of colon: Secondary | ICD-10-CM | POA: Diagnosis not present

## 2018-01-09 DIAGNOSIS — I10 Essential (primary) hypertension: Secondary | ICD-10-CM

## 2018-01-09 DIAGNOSIS — Z125 Encounter for screening for malignant neoplasm of prostate: Secondary | ICD-10-CM

## 2018-01-09 DIAGNOSIS — E785 Hyperlipidemia, unspecified: Secondary | ICD-10-CM | POA: Diagnosis not present

## 2018-01-09 MED ORDER — FLUTICASONE PROPIONATE 50 MCG/ACT NA SUSP: NASAL | 5 refills | Status: AC

## 2018-01-09 MED ORDER — DOXAZOSIN MESYLATE 4 MG PO TABS: ORAL_TABLET | ORAL | 1 refills | Status: DC

## 2018-01-09 MED ORDER — AMLODIPINE BESYLATE 5 MG PO TABS: ORAL_TABLET | ORAL | 1 refills | Status: DC

## 2018-01-09 MED ORDER — ALPRAZOLAM 0.5 MG PO TABS: ORAL_TABLET | ORAL | 5 refills | Status: DC

## 2018-01-09 MED ORDER — PRAVASTATIN SODIUM 80 MG PO TABS: 80.0000 mg | ORAL_TABLET | Freq: Every day | ORAL | 3 refills | Status: DC

## 2018-01-09 MED ORDER — PANTOPRAZOLE SODIUM 40 MG PO TBEC: 40.0000 mg | DELAYED_RELEASE_TABLET | Freq: Every day | ORAL | 1 refills | Status: DC

## 2018-01-09 NOTE — Progress Notes (Signed)
Subjective:    Patient ID: Kevin Sweeney, male    DOB: 1940-11-23, 77 y.o.   MRN: [id number]  Hypertension  This is a chronic problem. The current episode started more than 1 year ago. Pertinent negatives include no chest pain, headaches or shortness of breath. Risk factors for coronary artery disease include dyslipidemia. Treatments tried: norvasc, cardura and lisinopril. There are no compliance problems.    Patient with allergy symptoms.  Uses medication on a regular basis.  Has a lot of hayfever  Patient here for follow-up regarding cholesterol.  The patient does have hyperlipidemia.  Patient does try to maintain a reasonable diet.  Patient does take the medication on a regular basis.  Denies missing a dose.  The patient denies any obvious side effects.  Prior blood work results reviewed with the patient.  The patient is aware of his cholesterol goals and the need to keep it under good control to lessen the risk of disease.  Patient does have ongoing trouble with reflux.  Takes medication on a regular basis.  Tries to minimize foods as best they can.  They understand the importance of dietary compliance.  May also try to avoid eating a large meal close to bedtime.  Patient denies any dysphagia denies hematochezia.  States medicine does a good job keeping the problem under good control.  Without the medication may certainly have issues.They desire to continue taking their medication.  Mild insomnia uses Xanax at nighttime denies being depressed.   Review of Systems  Constitutional: Negative for activity change, appetite change and fatigue.  HENT: Negative for congestion and rhinorrhea.   Respiratory: Negative for cough, chest tightness and shortness of breath.   Cardiovascular: Negative for chest pain and leg swelling.  Gastrointestinal: Negative for abdominal pain, diarrhea and nausea.  Endocrine: Negative for polydipsia and polyphagia.  Genitourinary: Negative for dysuria and hematuria.    Neurological: Negative for weakness and headaches.  Psychiatric/Behavioral: Negative for confusion and dysphoric mood.       Objective:   Physical Exam  Constitutional: He appears well-nourished. No distress.  Cardiovascular: Normal rate, regular rhythm and normal heart sounds.  No murmur heard. Pulmonary/Chest: Effort normal and breath sounds normal. No respiratory distress.  Musculoskeletal: He exhibits no edema.  Lymphadenopathy:    He has no cervical adenopathy.  Neurological: He is alert.  Psychiatric: His behavior is normal.  Vitals reviewed.    Results for orders placed or performed in visit on 12/75/17  Basic metabolic panel  Result Value Ref Range   Glucose 94 65 - 99 mg/dL   BUN 7 (L) 8 - 27 mg/dL   Creatinine, Ser 0.92 0.76 - 1.27 mg/dL   GFR calc non Af Amer 82 >59 mL/min/1.73   GFR calc Af Amer 94 >59 mL/min/1.73   BUN/Creatinine Ratio 8 (L) 10 - 24   Sodium 136 134 - 144 mmol/L   Potassium 4.4 3.5 - 5.2 mmol/L   Chloride 94 (L) 96 - 106 mmol/L   CO2 25 20 - 29 mmol/L   Calcium 9.4 8.6 - 10.2 mg/dL  Lipid panel  Result Value Ref Range   Cholesterol, Total 143 100 - 199 mg/dL   Triglycerides 78 0 - 149 mg/dL   HDL 69 >39 mg/dL   VLDL Cholesterol Cal 16 5 - 40 mg/dL   LDL Calculated 58 0 - 99 mg/dL   Chol/HDL Ratio 2.1 0.0 - 5.0 ratio  Hepatic function panel  Result Value Ref Range   Total  Protein 6.6 6.0 - 8.5 g/dL   Albumin 4.3 3.5 - 4.8 g/dL   Bilirubin Total 0.5 0.0 - 1.2 mg/dL   Bilirubin, Direct 0.19 0.00 - 0.40 mg/dL   Alkaline Phosphatase 65 39 - 117 IU/L   AST 13 0 - 40 IU/L   ALT 11 0 - 44 IU/L         Assessment & Plan:  HTN- Patient was seen today as part of a visit regarding hypertension. The importance of healthy diet and regular physical activity was discussed. The importance of compliance with medications discussed.  Ideal goal is to keep blood pressure low elevated levels certainly below 248/25 when possible.  The patient was  counseled that keeping blood pressure under control lessen his risk of complications.  The importance of regular follow-ups was discussed with the patient.  Low-salt diet such as DASH recommended.  Regular physical activity was recommended as well.  Patient was advised to keep regular follow-ups.  The patient was seen today as part of an evaluation regarding hyperlipidemia.  Recent lab work has been reviewed with the patient as well as the goals for good cholesterol care.  In addition to this medications have been discussed the importance of compliance with diet and medications discussed as well.  Finally the patient is aware that poor control of cholesterol, noncompliance can dramatically increase the risk of complications. The patient will keep regular office visits and the patient does agreed to periodic lab work.  The patient was seen today for GERD. Patient benefits from medication. Patient to continue medication. Keep all regular follow ups.  Insomnia uses Xanax intermittently for this allergic rhinitis allergy medicines as directed    IFBOT ordered.  Patient does not want colonoscopy  25 minutes was spent with the patient.  This statement verifies that 25 minutes was indeed spent with the patient.  More than 50% of this visit-total duration of the visit-was spent in counseling and coordination of care. The issues that the patient came in for today as reflected in the diagnosis (s) please refer to documentation for further details.

## 2018-02-10 DIAGNOSIS — M542 Cervicalgia: Secondary | ICD-10-CM | POA: Diagnosis not present

## 2018-02-10 DIAGNOSIS — M9901 Segmental and somatic dysfunction of cervical region: Secondary | ICD-10-CM | POA: Diagnosis not present

## 2018-02-12 DIAGNOSIS — M542 Cervicalgia: Secondary | ICD-10-CM | POA: Diagnosis not present

## 2018-02-12 DIAGNOSIS — M9901 Segmental and somatic dysfunction of cervical region: Secondary | ICD-10-CM | POA: Diagnosis not present

## 2018-03-19 ENCOUNTER — Ambulatory Visit (INDEPENDENT_AMBULATORY_CARE_PROVIDER_SITE_OTHER): Payer: PPO

## 2018-03-19 DIAGNOSIS — Z23 Encounter for immunization: Secondary | ICD-10-CM | POA: Diagnosis not present

## 2018-04-08 ENCOUNTER — Other Ambulatory Visit: Payer: Self-pay | Admitting: Family Medicine

## 2018-07-13 ENCOUNTER — Encounter: Payer: PPO | Admitting: Family Medicine

## 2018-07-19 ENCOUNTER — Other Ambulatory Visit: Payer: Self-pay | Admitting: Family Medicine

## 2018-07-29 DIAGNOSIS — Z125 Encounter for screening for malignant neoplasm of prostate: Secondary | ICD-10-CM | POA: Diagnosis not present

## 2018-07-29 DIAGNOSIS — E785 Hyperlipidemia, unspecified: Secondary | ICD-10-CM | POA: Diagnosis not present

## 2018-07-29 DIAGNOSIS — I10 Essential (primary) hypertension: Secondary | ICD-10-CM | POA: Diagnosis not present

## 2018-07-30 LAB — HEPATIC FUNCTION PANEL
ALT: 12 IU/L (ref 0–44)
AST: 17 IU/L (ref 0–40)
Albumin: 4.3 g/dL (ref 3.7–4.7)
Alkaline Phosphatase: 58 IU/L (ref 39–117)
Bilirubin Total: 0.6 mg/dL (ref 0.0–1.2)
Bilirubin, Direct: 0.23 mg/dL (ref 0.00–0.40)
Total Protein: 6.3 g/dL (ref 6.0–8.5)

## 2018-07-30 LAB — LIPID PANEL
CHOLESTEROL TOTAL: 139 mg/dL (ref 100–199)
Chol/HDL Ratio: 1.7 ratio (ref 0.0–5.0)
HDL: 81 mg/dL (ref >39)
LDL CALC: 45 mg/dL (ref 0–99)
TRIGLYCERIDES: 65 mg/dL (ref 0–149)
VLDL CHOLESTEROL CAL: 13 mg/dL (ref 5–40)

## 2018-07-30 LAB — BASIC METABOLIC PANEL
BUN / CREAT RATIO: 9 — AB (ref 10–24)
BUN: 9 mg/dL (ref 8–27)
CALCIUM: 9.5 mg/dL (ref 8.6–10.2)
CHLORIDE: 92 mmol/L — AB (ref 96–106)
CO2: 25 mmol/L (ref 20–29)
Creatinine, Ser: 0.95 mg/dL (ref 0.76–1.27)
GFR calc non Af Amer: 78 mL/min/{1.73_m2} (ref >59)
GFR, EST AFRICAN AMERICAN: 90 mL/min/{1.73_m2} (ref >59)
Glucose: 90 mg/dL (ref 65–99)
POTASSIUM: 4.5 mmol/L (ref 3.5–5.2)
SODIUM: 132 mmol/L — AB (ref 134–144)

## 2018-07-30 LAB — PSA: Prostate Specific Ag, Serum: 1.5 ng/mL (ref 0.0–4.0)

## 2018-08-05 ENCOUNTER — Encounter: Payer: Self-pay | Admitting: Family Medicine

## 2018-08-05 ENCOUNTER — Ambulatory Visit (INDEPENDENT_AMBULATORY_CARE_PROVIDER_SITE_OTHER): Payer: PPO | Admitting: Family Medicine

## 2018-08-05 VITALS — BP 144/76 | Ht 71.0 in | Wt 161.6 lb

## 2018-08-05 DIAGNOSIS — Z0000 Encounter for general adult medical examination without abnormal findings: Secondary | ICD-10-CM | POA: Diagnosis not present

## 2018-08-05 DIAGNOSIS — I10 Essential (primary) hypertension: Secondary | ICD-10-CM | POA: Diagnosis not present

## 2018-08-05 DIAGNOSIS — G4700 Insomnia, unspecified: Secondary | ICD-10-CM | POA: Diagnosis not present

## 2018-08-05 DIAGNOSIS — E785 Hyperlipidemia, unspecified: Secondary | ICD-10-CM | POA: Diagnosis not present

## 2018-08-05 MED ORDER — ZOSTER VAC RECOMB ADJUVANTED 50 MCG/0.5ML IM SUSR: 0.5000 mL | Freq: Once | INTRAMUSCULAR | 1 refills | Status: AC

## 2018-08-05 MED ORDER — AMLODIPINE BESYLATE 5 MG PO TABS: ORAL_TABLET | ORAL | 1 refills | Status: DC

## 2018-08-05 MED ORDER — DOXAZOSIN MESYLATE 4 MG PO TABS: ORAL_TABLET | ORAL | 1 refills | Status: DC

## 2018-08-05 MED ORDER — PANTOPRAZOLE SODIUM 40 MG PO TBEC: 40.0000 mg | DELAYED_RELEASE_TABLET | Freq: Every day | ORAL | 0 refills | Status: DC

## 2018-08-05 MED ORDER — ALPRAZOLAM 0.5 MG PO TABS: ORAL_TABLET | ORAL | 5 refills | Status: DC

## 2018-08-05 MED ORDER — LISINOPRIL 20 MG PO TABS: 20.0000 mg | ORAL_TABLET | Freq: Every day | ORAL | 1 refills | Status: DC

## 2018-08-05 NOTE — Progress Notes (Signed)
Subjective:    Patient ID: Kevin Sweeney, male    DOB: 12/13/1940, 78 y.o.   MRN: [id number]  HPI The patient comes in today for a wellness visit.  Patient suffers with insomnia.  This is been going on for a while.  The patient finds it necessary to use medication to help sleep.  Patient finds it if not using medication has significant troubles.  Denies abusing the medication.  Denies any negative side effects.  Patient for blood pressure check up.  The patient does have hypertension.  The patient is on medication.  Patient relates compliance with meds. Todays BP reviewed with the patient. Patient denies issues with medication. Patient relates reasonable diet. Patient tries to minimize salt. Patient aware of BP goals.  Patient here for follow-up regarding cholesterol.  The patient does have hyperlipidemia.  Patient does try to maintain a reasonable diet.  Patient does take the medication on a regular basis.  Denies missing a dose.  The patient denies any obvious side effects.  Prior blood work results reviewed with the patient.  The patient is aware of his cholesterol goals and the need to keep it under good control to lessen the risk of disease.    A review of their health history was completed.  A review of medications was also completed.  Any needed refills; yes  Eating habits: trying to eat healthy  Falls/  MVA accidents in past few months: no  Regular exercise: everyday- walking 30-45 min  Specialist pt sees on regular basis: no  Preventative health issues were discussed.   Additional concerns: allergies- eyes water and nose runs                                   Dealing with grief over recent death of daughter                                    but it is getting better    Review of Systems  Constitutional: Negative for activity change, appetite change and fever.  HENT: Negative for congestion and rhinorrhea.   Eyes: Negative for discharge.  Respiratory: Negative for  cough and wheezing.   Cardiovascular: Negative for chest pain.  Gastrointestinal: Negative for abdominal pain, blood in stool and vomiting.  Genitourinary: Negative for difficulty urinating and frequency.  Musculoskeletal: Negative for neck pain.  Skin: Negative for rash.  Allergic/Immunologic: Negative for environmental allergies and food allergies.  Neurological: Negative for weakness and headaches.  Psychiatric/Behavioral: Negative for agitation.       Objective:   Physical Exam Constitutional:      Appearance: He is well-developed.  HENT:     Head: Normocephalic and atraumatic.     Right Ear: External ear normal.     Left Ear: External ear normal.     Nose: Nose normal.  Eyes:     Pupils: Pupils are equal, round, and reactive to light.  Neck:     Musculoskeletal: Normal range of motion and neck supple.     Thyroid: No thyromegaly.  Cardiovascular:     Rate and Rhythm: Normal rate and regular rhythm.     Heart sounds: Normal heart sounds. No murmur.  Pulmonary:     Effort: Pulmonary effort is normal. No respiratory distress.     Breath sounds: Normal breath sounds. No wheezing.  Abdominal:     General: Bowel sounds are normal. There is no distension.     Palpations: Abdomen is soft. There is no mass.     Tenderness: There is no abdominal tenderness.  Genitourinary:    Penis: Normal.   Musculoskeletal: Normal range of motion.  Lymphadenopathy:     Cervical: No cervical adenopathy.  Skin:    General: Skin is warm and dry.     Findings: No erythema.  Neurological:     Mental Status: He is alert.     Motor: No abnormal muscle tone.  Psychiatric:        Behavior: Behavior normal.        Judgment: Judgment normal.    Prostate exam normal Patient denies being depressed Denies feeling anxious Stressed out about the loss of his daughter but is getting through this       Assessment & Plan:  Patient defers on colonoscopy Adult wellness-complete.wellness physical  was conducted today. Importance of diet and exercise were discussed in detail.  In addition to this a discussion regarding safety was also covered. We also reviewed over immunizations and gave recommendations regarding current immunization needed for age.  In addition to this additional areas were also touched on including: Preventative health exams needed:  Colonoscopy defers  Patient was advised yearly wellness exam  Blood pressure mildly elevated but blood pressures at home much better.  Patient content with medication as is  The patient was seen today as part of an evaluation regarding hyperlipidemia.  Recent lab work has been reviewed with the patient as well as the goals for good cholesterol care.  In addition to this medications have been discussed the importance of compliance with diet and medications discussed as well.  Finally the patient is aware that poor control of cholesterol, noncompliance can dramatically increase the risk of complications. The patient will keep regular office visits and the patient does agreed to periodic lab work.

## 2018-08-05 NOTE — Patient Instructions (Addendum)
Results for orders placed or performed in visit on 01/09/18  Lipid panel  Result Value Ref Range   Cholesterol, Total 139 100 - 199 mg/dL   Triglycerides 65 0 - 149 mg/dL   HDL 81 >39 mg/dL   VLDL Cholesterol Cal 13 5 - 40 mg/dL   LDL Calculated 45 0 - 99 mg/dL   Chol/HDL Ratio 1.7 0.0 - 5.0 ratio  Hepatic function panel  Result Value Ref Range   Total Protein 6.3 6.0 - 8.5 g/dL   Albumin 4.3 3.7 - 4.7 g/dL   Bilirubin Total 0.6 0.0 - 1.2 mg/dL   Bilirubin, Direct 0.23 0.00 - 0.40 mg/dL   Alkaline Phosphatase 58 39 - 117 IU/L   AST 17 0 - 40 IU/L   ALT 12 0 - 44 IU/L  Basic metabolic panel  Result Value Ref Range   Glucose 90 65 - 99 mg/dL   BUN 9 8 - 27 mg/dL   Creatinine, Ser 0.95 0.76 - 1.27 mg/dL   GFR calc non Af Amer 78 >59 mL/min/1.73   GFR calc Af Amer 90 >59 mL/min/1.73   BUN/Creatinine Ratio 9 (L) 10 - 24   Sodium 132 (L) 134 - 144 mmol/L   Potassium 4.5 3.5 - 5.2 mmol/L   Chloride 92 (L) 96 - 106 mmol/L   CO2 25 20 - 29 mmol/L   Calcium 9.5 8.6 - 10.2 mg/dL  PSA  Result Value Ref Range   Prostate Specific Ag, Serum 1.5 0.0 - 4.0 ng/mL   Shingrix and shingles prevention: know the facts!   Shingrix is a very effective vaccine to prevent shingles.   Shingles is a reactivation of chickenpox -more than 99% of Americans born before 1980 have had chickenpox even if they do not remember it. One in every 10 people who get shingles have severe long-lasting nerve pain as a result.   33 out of a 100 older adults will get shingles if they are unvaccinated.     This vaccine is very important for your health This vaccine is indicated for anyone 50 years or older. You can get this vaccine even if you have already had shingles because you can get the disease more than once in a lifetime.  Your risk for shingles and its complications increases with age.  This vaccine has 2 doses.  The second dose would be 2 to 6 months after the first dose.  If you had Zostavax vaccine in  the past you should still get Shingrix. ( Zostavax is only 70% effective and it loses significant strength over a few years .)  This vaccine is given through the pharmacy.  The cost of the vaccine is through your insurance. The pharmacy can inform you of the total costs.  Common side effects including soreness in the arm, some redness and swelling, also some feel fatigue muscle soreness headache low-grade fever.  Side effects typically go away within 2 to 3 days. Remember-the pain from shingles can last a lifetime but these side effects of the vaccine will only last a few days at most. It is very important to get both doses in order to protect yourself fully.   Please get this vaccine at your earliest convenience at your trusted pharmacy.

## 2019-01-12 ENCOUNTER — Other Ambulatory Visit: Payer: Self-pay | Admitting: Family Medicine

## 2019-02-10 ENCOUNTER — Telehealth: Payer: Self-pay | Admitting: Family Medicine

## 2019-02-10 DIAGNOSIS — E785 Hyperlipidemia, unspecified: Secondary | ICD-10-CM

## 2019-02-10 DIAGNOSIS — I10 Essential (primary) hypertension: Secondary | ICD-10-CM

## 2019-02-10 NOTE — Telephone Encounter (Signed)
Last labs 07/29/18 psa, bmp, lipid, liver

## 2019-02-10 NOTE — Telephone Encounter (Signed)
Pt has a PHONE 6 month med check on 02/17/2019 & would like to know if he needs to get any lab work done  Please advise & call pt when ready

## 2019-02-11 NOTE — Telephone Encounter (Signed)
Orders put in. Pt notified.  

## 2019-02-11 NOTE — Telephone Encounter (Signed)
Lipid, metabolic 7-hyperlipidemia hypertension

## 2019-02-12 DIAGNOSIS — I10 Essential (primary) hypertension: Secondary | ICD-10-CM | POA: Diagnosis not present

## 2019-02-12 DIAGNOSIS — E785 Hyperlipidemia, unspecified: Secondary | ICD-10-CM | POA: Diagnosis not present

## 2019-02-13 LAB — LIPID PANEL
Chol/HDL Ratio: 1.5 ratio (ref 0.0–5.0)
Cholesterol, Total: 130 mg/dL (ref 100–199)
HDL: 84 mg/dL (ref >39)
LDL Calculated: 20 mg/dL (ref 0–99)
Triglycerides: 129 mg/dL (ref 0–149)
VLDL Cholesterol Cal: 26 mg/dL (ref 5–40)

## 2019-02-13 LAB — BASIC METABOLIC PANEL
BUN/Creatinine Ratio: 11 (ref 10–24)
BUN: 12 mg/dL (ref 8–27)
CO2: 27 mmol/L (ref 20–29)
Calcium: 8.9 mg/dL (ref 8.6–10.2)
Chloride: 93 mmol/L — ABNORMAL LOW (ref 96–106)
Creatinine, Ser: 1.06 mg/dL (ref 0.76–1.27)
GFR calc Af Amer: 78 mL/min/{1.73_m2} (ref >59)
GFR calc non Af Amer: 68 mL/min/{1.73_m2} (ref >59)
Glucose: 98 mg/dL (ref 65–99)
Potassium: 3.9 mmol/L (ref 3.5–5.2)
Sodium: 134 mmol/L (ref 134–144)

## 2019-02-17 ENCOUNTER — Ambulatory Visit (INDEPENDENT_AMBULATORY_CARE_PROVIDER_SITE_OTHER): Payer: PPO | Admitting: Family Medicine

## 2019-02-17 ENCOUNTER — Other Ambulatory Visit: Payer: Self-pay

## 2019-02-17 ENCOUNTER — Encounter: Payer: Self-pay | Admitting: Family Medicine

## 2019-02-17 VITALS — BP 137/70 | Ht 71.0 in | Wt 166.5 lb

## 2019-02-17 DIAGNOSIS — R600 Localized edema: Secondary | ICD-10-CM

## 2019-02-17 DIAGNOSIS — E785 Hyperlipidemia, unspecified: Secondary | ICD-10-CM

## 2019-02-17 DIAGNOSIS — I10 Essential (primary) hypertension: Secondary | ICD-10-CM

## 2019-02-17 MED ORDER — PRAVASTATIN SODIUM 80 MG PO TABS: 80.0000 mg | ORAL_TABLET | Freq: Every day | ORAL | 3 refills | Status: DC

## 2019-02-17 MED ORDER — AMLODIPINE BESYLATE 5 MG PO TABS: ORAL_TABLET | ORAL | 1 refills | Status: DC

## 2019-02-17 MED ORDER — DOXAZOSIN MESYLATE 4 MG PO TABS: ORAL_TABLET | ORAL | 1 refills | Status: DC

## 2019-02-17 MED ORDER — PANTOPRAZOLE SODIUM 40 MG PO TBEC: 40.0000 mg | DELAYED_RELEASE_TABLET | Freq: Every day | ORAL | 1 refills | Status: DC

## 2019-02-17 MED ORDER — LISINOPRIL 20 MG PO TABS: 20.0000 mg | ORAL_TABLET | Freq: Every day | ORAL | 1 refills | Status: DC

## 2019-02-17 NOTE — Progress Notes (Signed)
Subjective:    Patient ID: Kevin Sweeney, male    DOB: April 04, 1941, 78 y.o.   MRN: [id number]  BP 137/70 this morning and weight 166.5 lbs per pt.  Hyperlipidemia This is a chronic problem. Pertinent negatives include no chest pain or shortness of breath. Treatments tried: pravastatin 80mg . Compliance problems include adherence to exercise.    Feet and ankles swelling. Started 2 - 3 weeks ago.  Patient relates some swelling he denies any major issues denies PND orthopnea.  Relates this started about the same time coronavirus had he does not stated severe  Not getting out and exercising like he did before corona.  Patient is not doing much in way of exercise or getting out he has been very careful  Has started back smoking cigars due to boredom.  Patient was cautioned against as he denies being depressed Results for orders placed or performed in visit on 02/10/19  Lipid panel  Result Value Ref Range   Cholesterol, Total 130 100 - 199 mg/dL   Triglycerides 129 0 - 149 mg/dL   HDL 84 >39 mg/dL   VLDL Cholesterol Cal 26 5 - 40 mg/dL   LDL Calculated 20 0 - 99 mg/dL   Chol/HDL Ratio 1.5 0.0 - 5.0 ratio  Basic metabolic panel  Result Value Ref Range   Glucose 98 65 - 99 mg/dL   BUN 12 8 - 27 mg/dL   Creatinine, Ser 1.06 0.76 - 1.27 mg/dL   GFR calc non Af Amer 68 >59 mL/min/1.73   GFR calc Af Amer 78 >59 mL/min/1.73   BUN/Creatinine Ratio 11 10 - 24   Sodium 134 134 - 144 mmol/L   Potassium 3.9 3.5 - 5.2 mmol/L   Chloride 93 (L) 96 - 106 mmol/L   CO2 27 20 - 29 mmol/L   Calcium 8.9 8.6 - 10.2 mg/dL    Virtual Visit via Telephone Note  I connected with Kevin Sweeney on 02/17/19 at  1:40 PM EDT by telephone and verified that I am speaking with the correct person using two identifiers.  Location: Patient: home Provider: office   I discussed the limitations, risks, security and privacy concerns of performing an evaluation and management service by telephone and the availability of  in person appointments. I also discussed with the patient that there may be a patient responsible charge related to this service. The patient expressed understanding and agreed to proceed.   History of Present Illness:    Observations/Objective:   Assessment and Plan:   Follow Up Instructions:    I discussed the assessment and treatment plan with the patient. The patient was provided an opportunity to ask questions and all were answered. The patient agreed with the plan and demonstrated an understanding of the instructions.   The patient was advised to call back or seek an in-person evaluation if the symptoms worsen or if the condition fails to improve as anticipated.  I provided 15 minutes of non-face-to-face time during this encounter.     Review of Systems  Constitutional: Negative for diaphoresis and fatigue.  HENT: Negative for congestion and rhinorrhea.   Respiratory: Negative for cough and shortness of breath.   Cardiovascular: Negative for chest pain and leg swelling.  Gastrointestinal: Negative for abdominal pain and diarrhea.  Skin: Negative for color change and rash.  Neurological: Negative for dizziness and headaches.  Psychiatric/Behavioral: Negative for behavioral problems and confusion.       Objective:   Physical Exam Today's visit was  via telephone Physical exam was not possible for this visit  Denies being depressed denies being suicidal  15 minutes was spent with patient today discussing healthcare issues which they came.  More than 50% of this visit-total duration of visit-was spent in counseling and coordination of care.  Please see diagnosis regarding the focus of this coordination and care Labs reviewed with the patient    Assessment & Plan:  Some pedal edema probably related to inactivity he is going to try to do more walking and then he will give [place] feedback on how this is done if it is not going well we will arrange to see him back at our  outside facility patient hesitant to comfort inside office visit  Blood pressure good control continue current measures watch salt diet stay active take medication  Hyperlipidemia doing great previous labs reviewed current labs reviewed continue medication follow-up if ongoing troubles.  Follow-up 6 months  Patient is somewhat stressed by everything that is going on in the world but does not want to be on an antidepressant he feels he can handle

## 2019-03-02 ENCOUNTER — Telehealth: Payer: Self-pay | Admitting: Family Medicine

## 2019-03-02 NOTE — Telephone Encounter (Signed)
Please put him in the 350 slot He can come around 340 Let him know we will see him late afternoon

## 2019-03-02 NOTE — Telephone Encounter (Signed)
Pt notified and he was added to the schedule

## 2019-03-02 NOTE — Telephone Encounter (Signed)
Seen 02/17/19 and at that time it was going on for 2 -3 weeks. Swelling goes down at night. No sob. No swelling in leg or pain in calf. Pt states you told him he could do a car visit to look at swelling. He wants to come tomorrow afternoon if possible

## 2019-03-02 NOTE — Telephone Encounter (Signed)
Patient is wanting to do car visit due to feet and ankles still swelling not any better.Please advice where to put him.

## 2019-03-03 ENCOUNTER — Other Ambulatory Visit: Payer: Self-pay

## 2019-03-03 ENCOUNTER — Ambulatory Visit (INDEPENDENT_AMBULATORY_CARE_PROVIDER_SITE_OTHER): Payer: PPO | Admitting: Family Medicine

## 2019-03-03 VITALS — BP 136/68

## 2019-03-03 DIAGNOSIS — I10 Essential (primary) hypertension: Secondary | ICD-10-CM

## 2019-03-03 DIAGNOSIS — R600 Localized edema: Secondary | ICD-10-CM

## 2019-03-03 MED ORDER — FUROSEMIDE 20 MG PO TABS: ORAL_TABLET | ORAL | 2 refills | Status: DC

## 2019-03-03 MED ORDER — PRAVASTATIN SODIUM 80 MG PO TABS: 80.0000 mg | ORAL_TABLET | Freq: Every day | ORAL | 1 refills | Status: AC

## 2019-03-03 MED ORDER — AMLODIPINE BESYLATE 2.5 MG PO TABS: ORAL_TABLET | ORAL | 1 refills | Status: DC

## 2019-03-03 MED ORDER — PANTOPRAZOLE SODIUM 40 MG PO TBEC: 40.0000 mg | DELAYED_RELEASE_TABLET | Freq: Every day | ORAL | 1 refills | Status: AC

## 2019-03-03 MED ORDER — DOXAZOSIN MESYLATE 4 MG PO TABS: ORAL_TABLET | ORAL | 1 refills | Status: AC

## 2019-03-03 MED ORDER — ALPRAZOLAM 0.5 MG PO TABS: ORAL_TABLET | ORAL | 4 refills | Status: AC

## 2019-03-03 MED ORDER — LISINOPRIL 20 MG PO TABS: 20.0000 mg | ORAL_TABLET | Freq: Every day | ORAL | 1 refills | Status: DC

## 2019-03-03 NOTE — Progress Notes (Signed)
   Subjective:    Patient ID: Kevin Sweeney, male    DOB: 1941-03-28, 78 y.o.   MRN: [id number]  HPIFeet and ankles swelling for a few weeks.  Past several weeks swelling in the ankles blood pressure is been up he does not do a whole lot of activity he sits a lot he is on blood pressure medicines which can contribute to pedal edema he states the edema is not bad in the morning he denies shortness of breath PND orthopnea denies chest pressure tightness does have some underlying COPD and does get short of breath with activity but he attributes this to the heat when it is cooler he is able to do more he is smoking several cigars a day I encouraged him to cut back on this he also drinks 2 beers a day I told him to cut back on this as well   Review of Systems  Constitutional: Negative for activity change, fatigue and fever.  HENT: Negative for congestion and rhinorrhea.   Respiratory: Negative for cough and shortness of breath.   Cardiovascular: Positive for leg swelling. Negative for chest pain.  Gastrointestinal: Negative for abdominal pain, diarrhea and nausea.  Genitourinary: Negative for dysuria and hematuria.  Neurological: Negative for weakness and headaches.  Psychiatric/Behavioral: Negative for agitation and behavioral problems.       Objective:   Physical Exam  Lungs are clear respiratory rate normal heart regular no murmurs an occasional skipped beat noted extremities 1+-2+ edema in the lower legs that goes all the way up to the mid tibia      Assessment & Plan:  Significant pedal edema We will reduce amlodipine down to 2.5 to see if this helps Also initiate Lasix 20 mg each morning Follow-up in approximately 3 weeks for a outside recheck visit Follow-up sooner problems We will do metabolic 7 in approximately 3 weeks

## 2019-03-04 NOTE — Addendum Note (Signed)
Addended by: Carmelina Noun on: 03/04/2019 08:55 AM   Modules accepted: Orders

## 2019-03-04 NOTE — Progress Notes (Signed)
Orders put in and mailed to pt to do in 3 weeks

## 2019-03-24 ENCOUNTER — Ambulatory Visit (INDEPENDENT_AMBULATORY_CARE_PROVIDER_SITE_OTHER): Payer: PPO | Admitting: Family Medicine

## 2019-03-24 ENCOUNTER — Other Ambulatory Visit: Payer: Self-pay

## 2019-03-24 DIAGNOSIS — I10 Essential (primary) hypertension: Secondary | ICD-10-CM | POA: Diagnosis not present

## 2019-03-24 DIAGNOSIS — Z23 Encounter for immunization: Secondary | ICD-10-CM | POA: Diagnosis not present

## 2019-03-24 DIAGNOSIS — R600 Localized edema: Secondary | ICD-10-CM

## 2019-03-24 NOTE — Progress Notes (Signed)
   Subjective:    Patient ID: Kevin Sweeney, male    DOB: [birth year], 78 y.o.   MRN: [id number]  HPI Follow up on bilateral leg swelling. Pt states swelling is better.  Patient here today for follow-up Patient and significant swelling in the legs we started a diuretic as well as a potassium tablet He is states his swelling is less His amlodipine was reduced on the last visit He denies any dizziness states blood pressures been doing well at home he is eating okay.  He still smokes he knows he should quit Would like flu vaccine today.   He states the swelling at night goes down in the morning and gradually builds back up   Review of Systems  Constitutional: Negative for diaphoresis and fatigue.  HENT: Negative for congestion and rhinorrhea.   Respiratory: Negative for cough and shortness of breath.   Cardiovascular: Negative for chest pain and leg swelling.  Gastrointestinal: Negative for abdominal pain and diarrhea.  Skin: Negative for color change and rash.  Neurological: Negative for dizziness and headaches.  Psychiatric/Behavioral: Negative for behavioral problems and confusion.   Positive for swelling in the legs    Objective:   Physical Exam Vitals signs reviewed.  Constitutional:      General: He is not in acute distress. HENT:     Head: Normocephalic and atraumatic.  Eyes:     General:        Right eye: No discharge.        Left eye: No discharge.  Neck:     Trachea: No tracheal deviation.  Cardiovascular:     Rate and Rhythm: Normal rate. Rhythm irregular.     Heart sounds: Normal heart sounds. No murmur.  Pulmonary:     Effort: Pulmonary effort is normal. No respiratory distress.     Breath sounds: Normal breath sounds.  Lymphadenopathy:     Cervical: No cervical adenopathy.  Skin:    General: Skin is warm and dry.  Neurological:     Mental Status: He is alert.     Coordination: Coordination normal.  Psychiatric:        Behavior: Behavior normal.           Assessment & Plan:  HTN good control continue current measures Pedal edema continue the diuretic Follow-up in approximately 1 weeks sooner if any problems Flu shot today  I am concerned about his heart it is irregular he states he thinks it is because he smoked earlier today he does not want to do EKG today states he will come back in 1 week I did warn him that this could be atrial fibrillation he may need to be on a blood thinner to help checked against possibility of stroke Patient will do his metabolic 7 somewhere in the near future

## 2019-03-25 DIAGNOSIS — I10 Essential (primary) hypertension: Secondary | ICD-10-CM | POA: Diagnosis not present

## 2019-03-26 ENCOUNTER — Other Ambulatory Visit: Payer: Self-pay | Admitting: *Deleted

## 2019-03-26 ENCOUNTER — Telehealth: Payer: Self-pay | Admitting: Family Medicine

## 2019-03-26 LAB — BASIC METABOLIC PANEL
BUN/Creatinine Ratio: 8 — ABNORMAL LOW (ref 10–24)
BUN: 21 mg/dL (ref 8–27)
CO2: 28 mmol/L (ref 20–29)
Calcium: 9 mg/dL (ref 8.6–10.2)
Chloride: 91 mmol/L — ABNORMAL LOW (ref 96–106)
Creatinine, Ser: 2.63 mg/dL — ABNORMAL HIGH (ref 0.76–1.27)
GFR calc Af Amer: 26 mL/min/{1.73_m2} — ABNORMAL LOW (ref >59)
GFR calc non Af Amer: 23 mL/min/{1.73_m2} — ABNORMAL LOW (ref >59)
Glucose: 98 mg/dL (ref 65–99)
Potassium: 3 mmol/L — ABNORMAL LOW (ref 3.5–5.2)
Sodium: 136 mmol/L (ref 134–144)

## 2019-03-26 MED ORDER — POTASSIUM CHLORIDE CRYS ER 20 MEQ PO TBCR: 20.0000 meq | EXTENDED_RELEASE_TABLET | Freq: Every day | ORAL | 5 refills | Status: DC

## 2019-03-26 NOTE — Telephone Encounter (Signed)
Patient's creatinine is elevated  I told the patient to stop the lisinopril. He may continue the furosemide and amlodipine  He will be coming to the office Wednesday at 11 AM in office visit. We will be doing an office visit and urinalysis along with an EKG  Front-please put him on the schedule for this coming Wednesday, October 7 at 11 AM he is aware of the appointment

## 2019-03-26 NOTE — Telephone Encounter (Signed)
Discussed with pt. Pt verbalized understanding of all. Med sent to pharm.  

## 2019-03-26 NOTE — Telephone Encounter (Signed)
Nurses I did talk with the patient this morning regarding his lab work Please also let him know his potassium is  low and therefore I want him to start potassium supplement  Please send in KCl 20 M EQ, 1 every morning, #30, 5 refills if possible he should start this today or tomorrow He will be coming next Wednesday at 11 AM he is aware of the appointment

## 2019-03-31 ENCOUNTER — Ambulatory Visit (INDEPENDENT_AMBULATORY_CARE_PROVIDER_SITE_OTHER): Payer: PPO | Admitting: Family Medicine

## 2019-03-31 ENCOUNTER — Other Ambulatory Visit: Payer: Self-pay

## 2019-03-31 VITALS — BP 164/76 | Temp 97.9°F | Wt 170.0 lb

## 2019-03-31 DIAGNOSIS — R009 Unspecified abnormalities of heart beat: Secondary | ICD-10-CM

## 2019-03-31 DIAGNOSIS — R748 Abnormal levels of other serum enzymes: Secondary | ICD-10-CM

## 2019-03-31 DIAGNOSIS — I493 Ventricular premature depolarization: Secondary | ICD-10-CM | POA: Diagnosis not present

## 2019-03-31 DIAGNOSIS — R9431 Abnormal electrocardiogram [ECG] [EKG]: Secondary | ICD-10-CM

## 2019-03-31 DIAGNOSIS — N3000 Acute cystitis without hematuria: Secondary | ICD-10-CM | POA: Diagnosis not present

## 2019-03-31 DIAGNOSIS — E876 Hypokalemia: Secondary | ICD-10-CM

## 2019-03-31 LAB — POCT URINALYSIS DIPSTICK
Blood, UA: POSITIVE
Nitrite, UA: POSITIVE
Protein, UA: POSITIVE — AB
Spec Grav, UA: <=1.005 — AB (ref 1.010–1.025)
pH, UA: 5 (ref 5.0–8.0)

## 2019-03-31 MED ORDER — CEPHALEXIN 500 MG PO CAPS: 500.0000 mg | ORAL_CAPSULE | Freq: Four times a day (QID) | ORAL | 0 refills | Status: DC

## 2019-03-31 NOTE — Progress Notes (Signed)
Subjective:    Patient ID: Kevin Sweeney, male    DOB: 04-29-1941, 78 y.o.   MRN: [id number]  HPI pt states he was told he had an irregular heart beat and his creatinine was elevated.  Last week patient came in had a irregular heartbeat and it was concerning so therefore it was recommended for the patient to come in today to have a EKG he did not want to have the EKG last week he denies any major setbacks or problems.  Denies chest tightness pressure pain or shortness of breath.  States energy level overall fairly decent He is getting older he recognizes this unfortunately he does have risk factors including smoking  He also has had pedal edema lately that was felt to be due to his amlodipine but it could also be potentially a sign of some underlying diastolic heart failure.  Under control currently with diuretics.  We will check a urine today because of the pedal edema to make sure he does not have protein in  25 minutes was spent with the patient.  This statement verifies that 25 minutes was indeed spent with the patient.  More than 50% of this visit-total duration of the visit-was spent in counseling and coordination of care. The issues that the patient came in for today as reflected in the diagnosis (s) please refer to documentation for further details.   Review of Systems  Constitutional: Negative for diaphoresis and fatigue.  HENT: Negative for congestion and rhinorrhea.   Respiratory: Negative for cough and shortness of breath.   Cardiovascular: Negative for chest pain and leg swelling.  Gastrointestinal: Negative for abdominal pain and diarrhea.  Skin: Negative for color change and rash.  Neurological: Negative for dizziness and headaches.  Psychiatric/Behavioral: Negative for behavioral problems and confusion.       Objective:   Physical Exam Vitals signs reviewed.  Constitutional:      General: He is not in acute distress. HENT:     Head: Normocephalic and atraumatic.   Eyes:     General:        Right eye: No discharge.        Left eye: No discharge.  Neck:     Trachea: No tracheal deviation.  Cardiovascular:     Rate and Rhythm: Normal rate and regular rhythm.     Heart sounds: Normal heart sounds. No murmur.  Pulmonary:     Effort: Pulmonary effort is normal. No respiratory distress.     Breath sounds: Normal breath sounds.  Lymphadenopathy:     Cervical: No cervical adenopathy.  Skin:    General: Skin is warm and dry.  Neurological:     Mental Status: He is alert.     Coordination: Coordination normal.  Psychiatric:        Behavior: Behavior normal.   Intermittent irregular beats are noted.  Otherwise heart regular  Extremities has some lower extremity edema around the ankles but he states this goes away at night comes back during the day He relates compliance with his medicines  EKG no previous EKG to compare to there is some mild ST segment depressions in multiple leads in addition to this PVCs otherwise normal sinus rhythm with some level of first-degree AV block  Urinalysis shows UTI with WBCs and bacteria    Assessment & Plan:  Abnormal EKG-I am concerned about the slight ST segment depressions I recommend consultation with cardiology he does have some risk factors for heart disease may well need  to have stress test or possible Myoview or even a catheterization as for the PVCs I am not worried about those currently but may need to have Holter monitoring  Pedal edema the pedal edema was felt to be secondary to his amlodipine and this has been adjusted and we have seen improvement but it is also possible he could be having some diastolic failure he may well benefit from having an echo referral to cardiology  UTI-culture sent-antibiotic prescribed

## 2019-04-02 LAB — URINE CULTURE

## 2019-04-02 LAB — SPECIMEN STATUS REPORT

## 2019-04-07 ENCOUNTER — Encounter: Payer: Self-pay | Admitting: Family Medicine

## 2019-04-09 ENCOUNTER — Telehealth: Payer: Self-pay | Admitting: Family Medicine

## 2019-04-09 NOTE — Telephone Encounter (Signed)
Nurses It is quite possible the swelling in his feet is not related to excessive fluid But instead is third spacing of fluids in his ankles This could be a side effect of the amlodipine I recommend the patient stop the furosemide. I also recommend patient consider going to urgent care but As a precaution I would also recommend scheduling a car visit for the patient on Monday with me Patient typically prefers car visit versus coming inside but his choice

## 2019-04-09 NOTE — Telephone Encounter (Signed)
Pt contacted office stating that his feet and ankles are still swollen. He is also feeling weak and has fell a couple of times. Pt states he has no strength and is also feeling nauseated. BP was 124/52 yesterday. Pt is using wife old walker and has not fell any more. Pt advised to go to Urgent Care due to weakness and nausea. Pt informed that Dr.Scott is out of the office and Dr.Steve has no more appt. Pts verbalized understanding. Pt states he may not have a way to get to Urgent Care today so he would call back Monday; informed pt to try to get a way to Urgent Care ASAP. Pt verbalized understanding.

## 2019-04-09 NOTE — Telephone Encounter (Signed)
Pt contacted and verbalized understanding. Pt scheduled for car visit Monday at 2 pm.

## 2019-04-12 ENCOUNTER — Other Ambulatory Visit: Payer: Self-pay

## 2019-04-12 ENCOUNTER — Ambulatory Visit (INDEPENDENT_AMBULATORY_CARE_PROVIDER_SITE_OTHER): Payer: PPO | Admitting: Family Medicine

## 2019-04-12 DIAGNOSIS — R600 Localized edema: Secondary | ICD-10-CM | POA: Diagnosis not present

## 2019-04-12 DIAGNOSIS — E876 Hypokalemia: Secondary | ICD-10-CM | POA: Diagnosis not present

## 2019-04-12 NOTE — Progress Notes (Signed)
   Subjective:    Patient ID: Kevin Sweeney, male    DOB: 05/02/1941, 78 y.o.   MRN: [id number]  HPI Pt states his feet and ankles have been swelling. Pt states he was taking Lasix but was told to stop them by provider. Pt states he has fell; he is now having to use a walker.   Patient finds himself feeling weaker.  States his oral intake is doing okay We did stop the Lasix because it is possible that this was causing significant diuresis causing hypotension potentially  Review of Systems  Constitutional: Negative for activity change, fatigue and fever.  HENT: Negative for congestion and rhinorrhea.   Respiratory: Negative for cough and shortness of breath.   Cardiovascular: Positive for leg swelling. Negative for chest pain.  Gastrointestinal: Negative for abdominal pain, diarrhea and nausea.  Genitourinary: Negative for dysuria and hematuria.  Neurological: Negative for weakness and headaches.  Psychiatric/Behavioral: Negative for agitation and behavioral problems.       Objective:   Physical Exam Blood pressure 134/78 Lungs clear respiratory rate normal heart regular no murmurs extremities trace edema in the lower legs 1+ edema in the feet skin warm dry      Assessment & Plan:  Current visit Some edema in the feet but not severe I would keep the blood pressure medicine as is More than likely the pedal edema related into inactivity as well as the amlodipine Patient will do follow-up within a few weeks Stay away from the furosemide Do lab work later this week  15 minutes was spent with patient today discussing healthcare issues which they came.  More than 50% of this visit-total duration of visit-was spent in counseling and coordination of care.  Please see diagnosis regarding the focus of this coordination and care

## 2019-04-20 ENCOUNTER — Encounter: Payer: Self-pay | Admitting: Family Medicine

## 2019-04-20 DIAGNOSIS — R600 Localized edema: Secondary | ICD-10-CM | POA: Diagnosis not present

## 2019-04-20 DIAGNOSIS — E876 Hypokalemia: Secondary | ICD-10-CM | POA: Diagnosis not present

## 2019-04-21 LAB — BASIC METABOLIC PANEL
BUN/Creatinine Ratio: 9 — ABNORMAL LOW (ref 10–24)
BUN: 20 mg/dL (ref 8–27)
CO2: 22 mmol/L (ref 20–29)
Calcium: 8.4 mg/dL — ABNORMAL LOW (ref 8.6–10.2)
Chloride: 99 mmol/L (ref 96–106)
Creatinine, Ser: 2.24 mg/dL — ABNORMAL HIGH (ref 0.76–1.27)
GFR calc Af Amer: 32 mL/min/{1.73_m2} — ABNORMAL LOW (ref >59)
GFR calc non Af Amer: 27 mL/min/{1.73_m2} — ABNORMAL LOW (ref >59)
Glucose: 110 mg/dL — ABNORMAL HIGH (ref 65–99)
Potassium: 2.9 mmol/L — ABNORMAL LOW (ref 3.5–5.2)
Sodium: 138 mmol/L (ref 134–144)

## 2019-04-21 LAB — HEPATIC FUNCTION PANEL
ALT: 58 IU/L — ABNORMAL HIGH (ref 0–44)
AST: 50 IU/L — ABNORMAL HIGH (ref 0–40)
Albumin: 3.6 g/dL — ABNORMAL LOW (ref 3.7–4.7)
Alkaline Phosphatase: 102 IU/L (ref 39–117)
Bilirubin Total: 0.4 mg/dL (ref 0.0–1.2)
Bilirubin, Direct: 0.19 mg/dL (ref 0.00–0.40)
Total Protein: 5.8 g/dL — ABNORMAL LOW (ref 6.0–8.5)

## 2019-04-22 ENCOUNTER — Encounter: Payer: Self-pay | Admitting: Family Medicine

## 2019-04-22 NOTE — Addendum Note (Signed)
Addended by: Dairl Ponder on: 04/22/2019 03:44 PM   Modules accepted: Orders

## 2019-05-03 DIAGNOSIS — R600 Localized edema: Secondary | ICD-10-CM | POA: Diagnosis not present

## 2019-05-03 DIAGNOSIS — E876 Hypokalemia: Secondary | ICD-10-CM | POA: Diagnosis not present

## 2019-05-04 LAB — BASIC METABOLIC PANEL
BUN/Creatinine Ratio: 7 — ABNORMAL LOW (ref 10–24)
BUN: 12 mg/dL (ref 8–27)
CO2: 22 mmol/L (ref 20–29)
Calcium: 8.1 mg/dL — ABNORMAL LOW (ref 8.6–10.2)
Chloride: 100 mmol/L (ref 96–106)
Creatinine, Ser: 1.83 mg/dL — ABNORMAL HIGH (ref 0.76–1.27)
GFR calc Af Amer: 41 mL/min/{1.73_m2} — ABNORMAL LOW (ref >59)
GFR calc non Af Amer: 35 mL/min/{1.73_m2} — ABNORMAL LOW (ref >59)
Glucose: 93 mg/dL (ref 65–99)
Potassium: 4.2 mmol/L (ref 3.5–5.2)
Sodium: 136 mmol/L (ref 134–144)

## 2019-05-04 LAB — MAGNESIUM: Magnesium: 1 mg/dL — ABNORMAL LOW (ref 1.6–2.3)

## 2019-05-11 ENCOUNTER — Ambulatory Visit (INDEPENDENT_AMBULATORY_CARE_PROVIDER_SITE_OTHER): Payer: PPO | Admitting: Family Medicine

## 2019-05-11 DIAGNOSIS — I10 Essential (primary) hypertension: Secondary | ICD-10-CM | POA: Diagnosis not present

## 2019-05-11 DIAGNOSIS — R600 Localized edema: Secondary | ICD-10-CM | POA: Diagnosis not present

## 2019-05-11 DIAGNOSIS — N289 Disorder of kidney and ureter, unspecified: Secondary | ICD-10-CM

## 2019-05-11 MED ORDER — MAGNESIUM OXIDE -MG SUPPLEMENT 400 (240 MG) MG PO TABS: ORAL_TABLET | ORAL | 12 refills | Status: AC

## 2019-05-11 NOTE — Progress Notes (Signed)
   Subjective:    Patient ID: Kevin Sweeney, male    DOB: Jun 25, 1940, 78 y.o.   MRN: [id number]  HPI Car visit #for patient safety Patient following up for blood pressure as well as swelling in the legs Patient denies any type of chest tightness pressure pain denies shortness of breath Patient does have risk factors for heart disease On previous visit EKG showed some ST segment depressions he is referred to cardiology So far the patient has not heard anything from cardiology Patient denies any new symptoms.   Review of Systems  Constitutional: Negative for activity change.  HENT: Negative for congestion and rhinorrhea.   Respiratory: Negative for cough and shortness of breath.   Cardiovascular: Positive for leg swelling. Negative for chest pain.  Gastrointestinal: Negative for abdominal pain, diarrhea, nausea and vomiting.  Genitourinary: Negative for dysuria and hematuria.  Neurological: Negative for weakness and headaches.  Psychiatric/Behavioral: Negative for behavioral problems and confusion.       Objective:   Physical Exam Vitals signs reviewed.  Constitutional:      General: He is not in acute distress. HENT:     Head: Normocephalic and atraumatic.  Eyes:     General:        Right eye: No discharge.        Left eye: No discharge.  Neck:     Trachea: No tracheal deviation.  Cardiovascular:     Rate and Rhythm: Normal rate and regular rhythm.     Heart sounds: Normal heart sounds. No murmur.  Pulmonary:     Effort: Pulmonary effort is normal. No respiratory distress.     Breath sounds: Normal breath sounds.  Lymphadenopathy:     Cervical: No cervical adenopathy.  Skin:    General: Skin is warm and dry.  Neurological:     Mental Status: He is alert.     Coordination: Coordination normal.  Psychiatric:        Behavior: Behavior normal.    Pedal edema is noted but the lower legs minimal edema       Assessment & Plan:  It is felt the edema was due to the  amlodipine Should also be noted that the patient creatinine went up significantly when on diuretic  Recent lab work looks good with still significant renal insufficiency but numbers do look better Magnesium is low we will send in supplement  We will we communicate with cardiology to have them reach out to the patient to hopefully help set up follow-up office visit with them for evaluation of EKG as well as the patient from a cardiovascular risk standpoint

## 2019-06-11 ENCOUNTER — Telehealth: Payer: Self-pay | Admitting: Family Medicine

## 2019-06-11 NOTE — Telephone Encounter (Signed)
Pt contacted office and is wanting to know if he is suppose to be taking Lisinopril 20 mg. Pt states that he was taken off of meds back in October and is not sure if he to be taking Lisinopril or not. Pt has not been taking Lisinopril. Please advise. Thank you

## 2019-06-12 NOTE — Telephone Encounter (Signed)
We stopped his lisinopril back in October because of significant elevation of creatinine.  He should stay off of lisinopril.  Please also inform his pharmacy that he is no longer on lisinopril so they do not accidentally keep sending to him.

## 2019-06-14 NOTE — Telephone Encounter (Signed)
I also notified [facility] in [place] to cancel lisinopril

## 2019-06-14 NOTE — Telephone Encounter (Signed)
Called and discussed with pt and he verbalized understanding.  

## 2019-07-01 ENCOUNTER — Ambulatory Visit (INDEPENDENT_AMBULATORY_CARE_PROVIDER_SITE_OTHER): Payer: PPO | Admitting: Cardiology

## 2019-07-01 ENCOUNTER — Other Ambulatory Visit: Payer: Self-pay

## 2019-07-01 ENCOUNTER — Encounter: Payer: Self-pay | Admitting: Cardiology

## 2019-07-01 VITALS — BP 182/76 | HR 120 | Ht 70.0 in | Wt 154.8 lb

## 2019-07-01 DIAGNOSIS — I10 Essential (primary) hypertension: Secondary | ICD-10-CM

## 2019-07-01 DIAGNOSIS — R9431 Abnormal electrocardiogram [ECG] [EKG]: Secondary | ICD-10-CM

## 2019-07-01 NOTE — Patient Instructions (Signed)
Your physician recommends that you schedule a follow-up appointment in: [place]  Your physician recommends that you continue on your current medications as directed. Please refer to the Current Medication list given to you today.  Your physician has requested that you have an echocardiogram. Echocardiography is a painless test that uses sound waves to create images of your heart. It provides your doctor with information about the size and shape of your heart and how well your heart's chambers and valves are working. This procedure takes approximately one hour. There are no restrictions for this procedure.  RECORD BLOOD PRESSURE AND HEART RATE DAILY FOR 1 WEEK AND CALL WITH READINGS  Thank you for choosing [facility]!!

## 2019-07-01 NOTE — Progress Notes (Signed)
Clinical Summary Mr. Rodela is a 79 y.o.male seen as new consult, referred by Dr Wolfgang Phoenix for PVCs  1. Abnormal EKG - from pcp note 03/2019 noted some ST/T changes, primarily inferior ST depressions - no chest pain. No SOB or DOE - no significant palpiations - sedentary lifestyle,    2. HTN - pcp notes mention norvasc was supsected to have caused some LE edema - prior elevation of Cr on diuretic that is resolving - he reports a significant history of white coat HTN   4. 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 Drinks daily vodka regularly daily.   5> LE edema - ongoing last 6 to 8 months, bilateral   Past Medical History:  Diagnosis Date  . Hyperlipidemia   . Hypertension      Allergies  Allergen Reactions  . Tetanus Toxoids Hives and Swelling     Current Outpatient Medications  Medication Sig Dispense Refill  . ALPRAZolam (XANAX) 0.5 MG tablet TAKE ONE TABLET BY MOUTH AT BEDTIME AS NEEDED FOR ANXIETY OR SLEEP 30 tablet 4  . amLODipine (NORVASC) 2.5 MG tablet 1 qd 90 tablet 1  . aspirin 81 MG tablet Take 81 mg by mouth daily.    . cephALEXin (KEFLEX) 500 MG capsule Take 1 capsule (500 mg total) by mouth 4 (four) times daily. 28 capsule 0  . doxazosin (CARDURA) 4 MG tablet TAKE 1 TABLET BY MOUTH ONCE DAILY EACH EVENING. 90 tablet 1  . fexofenadine (ALLEGRA) 180 MG tablet Take 180 mg by mouth daily.    . fluticasone (FLONASE) 50 MCG/ACT nasal spray USE TWO SPRAY(S) IN EACH NOSTRIL ONCE DAILY 16 g 5  . Magnesium Oxide 400 (240 Mg) MG TABS 1 tablet daily 30 tablet 12  . OVER THE COUNTER MEDICATION Vit d 3 every day    . pantoprazole (PROTONIX) 40 MG tablet Take 1 tablet (40 mg total) by mouth daily. 90 tablet 1  . potassium chloride SA (KLOR-CON) 20 MEQ tablet Take 20 mEq by mouth. One tablet twice a day for one week then one tablet daily    . pravastatin (PRAVACHOL) 80 MG tablet Take 1 tablet (80 mg total) by mouth daily. 90 tablet 1   . Simethicone (GAS-X PO) Take by mouth. As needed     No current facility-administered medications for this visit.     Past Surgical History:  Procedure Laterality Date  . Cyst removed  1975     Allergies  Allergen Reactions  . Tetanus Toxoids Hives and Swelling      No family history on file.   Social History Mr. Kapla reports that he has been smoking cigars. He quit smokeless tobacco use about 3 years ago. Mr. Rensel has no history on file for alcohol.   Review of Systems CONSTITUTIONAL: No weight loss, fever, chills, weakness or fatigue.  HEENT: Eyes: No visual loss, blurred vision, double vision or yellow sclerae.No hearing loss, sneezing, congestion, runny nose or sore throat.  SKIN: No rash or itching.  CARDIOVASCULAR: per hpi RESPIRATORY: No shortness of breath, cough or sputum.  GASTROINTESTINAL: No anorexia, nausea, vomiting or diarrhea. No abdominal pain or blood.  GENITOURINARY: No burning on urination, no polyuria NEUROLOGICAL: No headache, dizziness, syncope, paralysis, ataxia, numbness or tingling in the extremities. No change in bowel or bladder control.  MUSCULOSKELETAL: No muscle, back pain, joint pain or stiffness.  LYMPHATICS: No enlarged nodes. No history of splenectomy.  PSYCHIATRIC: No history of depression or  anxiety.  ENDOCRINOLOGIC: No reports of sweating, cold or heat intolerance. No polyuria or polydipsia.  Marland Kitchen   Physical Examination Today's Vitals   07/01/19 1305 07/01/19 1316  BP: (!) 185/81 (!) 182/76  Pulse: (!) 120 (!) 120  SpO2: 98% 98%  Weight: 154 lb 12.8 oz (70.2 kg)   Height: 5\' 10"  (1.778 m)    Body mass index is 22.21 kg/m.  Gen: resting comfortably, no acute distress HEENT: no scleral icterus, pupils equal round and reactive, no palptable cervical adenopathy,  CV: tachy 110 no m/r/g, no jvd Resp: Clear to auscultation bilaterally GI: abdomen is soft, non-tender, non-distended, normal bowel sounds, no  hepatosplenomegaly MSK: extremities are warm, no edema.  Skin: warm, no rash Neuro:  no focal deficits Psych: appropriate affect    Assessment and Plan  1. Abnormal EKG - nonspecific inferior ST/T changes. Denies any significant chest pain or SOB/DOE - he reports sinus tachycardia is chronic for him, from chart review this appears to be the case - given his EKG findigns and LE edema would plan for echo  2. LE Edema - unclear etiology. Will obtain echo to evaluate for any underlying cardiac dysfunction  3. HTN - elevated bp. He reports longterm issues with white coat HTN - he will submit a bp log in 1 week from home - avoid higher norvasc dose due to LE edema, significant AKI when on diuretic before with ongoing renal dysfunction. Would consider coreg.   4. 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

## 2019-07-05 ENCOUNTER — Telehealth: Payer: Self-pay | Admitting: *Deleted

## 2019-07-05 NOTE — Telephone Encounter (Signed)
07/02/19 BP 142/75 HR 94 07/03/19 BP 151/89 HR 88 07/04/19 BP 152/65 HR 90 07/05/19 BP 140/72 HR 88  Medications reviewed.

## 2019-07-06 MED ORDER — CARVEDILOL 6.25 MG PO TABS: 6.2500 mg | ORAL_TABLET | Freq: Two times a day (BID) | ORAL | 3 refills | Status: DC

## 2019-07-06 NOTE — Telephone Encounter (Signed)
Bp's running too high, can we start coreg 6.25mg  bid   Zandra Abts MD

## 2019-07-06 NOTE — Telephone Encounter (Signed)
Patient informed and verbalized understanding of plan. 

## 2019-07-14 ENCOUNTER — Other Ambulatory Visit: Payer: Self-pay

## 2019-07-14 ENCOUNTER — Ambulatory Visit (INDEPENDENT_AMBULATORY_CARE_PROVIDER_SITE_OTHER): Payer: PPO

## 2019-07-14 DIAGNOSIS — R9431 Abnormal electrocardiogram [ECG] [EKG]: Secondary | ICD-10-CM

## 2019-07-19 ENCOUNTER — Telehealth: Payer: Self-pay | Admitting: Cardiology

## 2019-07-19 NOTE — Progress Notes (Signed)
I reviewed echo images. Difficult evaluation due to frequency ectopy and elevated heart rates, poor acoustic windows, however LVEF does appear decreased around 40%. Will have nursing staff increasea coreg, f/u with me 3 weeks. Work to optimize medical therapy and heart rates, then repeat echo with contrast, if ongoing dysfunction at that time consider ischemic evaluation   Zandra Abts MD

## 2019-07-20 ENCOUNTER — Telehealth: Payer: Self-pay | Admitting: *Deleted

## 2019-07-20 NOTE — Telephone Encounter (Signed)
LM to return call.

## 2019-07-20 NOTE — Telephone Encounter (Signed)
-----   Message from Arnoldo Lenis, MD sent at 07/19/2019 12:18 PM EST ----- Echo suggests some weakness in the pumping function of his heart, this would explain some of his issues with swelling. Can he increase his coreg to 12.5mg  bid, see me back in 3 weeks in clinic.  Zandra Abts MD

## 2019-07-22 MED ORDER — CARVEDILOL 12.5 MG PO TABS: 12.5000 mg | ORAL_TABLET | Freq: Two times a day (BID) | ORAL | 1 refills | Status: AC

## 2019-07-22 NOTE — Telephone Encounter (Signed)
Pt voiced understanding - Coreg 12.5 mg bid sent to [facility] [place] - will forward to provider on when he could see pt in clinic next available is in March

## 2019-07-29 ENCOUNTER — Other Ambulatory Visit: Payer: Self-pay | Admitting: Family Medicine

## 2019-08-11 ENCOUNTER — Other Ambulatory Visit: Payer: Self-pay

## 2019-08-11 ENCOUNTER — Ambulatory Visit (INDEPENDENT_AMBULATORY_CARE_PROVIDER_SITE_OTHER): Payer: PPO | Admitting: Family Medicine

## 2019-08-11 ENCOUNTER — Other Ambulatory Visit: Payer: Self-pay | Admitting: Family Medicine

## 2019-08-11 ENCOUNTER — Encounter: Payer: Self-pay | Admitting: Family Medicine

## 2019-08-11 DIAGNOSIS — B029 Zoster without complications: Secondary | ICD-10-CM

## 2019-08-11 MED ORDER — SHINGRIX 50 MCG/0.5ML IM SUSR: 0.5000 mL | Freq: Once | INTRAMUSCULAR | 1 refills | Status: AC

## 2019-08-11 MED ORDER — VALACYCLOVIR HCL 1 G PO TABS: ORAL_TABLET | ORAL | 0 refills | Status: DC

## 2019-08-11 NOTE — Progress Notes (Signed)
   Subjective:  Audio only  Patient ID: Kevin Sweeney, male    DOB: September 20, 1940, 79 y.o.   MRN: [id number]  HPI Pt has rash around torso area, on back and on lower part of stomach.(Pt states he knows its shingles) Pt states they do not hurt but they itch. Pt states that he noticed them this morning. No treatments tried. Pt sent pictures via MyChart Virtual Visit via Telephone Note  I connected with Kevin Sweeney on 08/11/19 at  3:50 PM EST by telephone and verified that I am speaking with the correct person using two identifiers.  Location: Patient: home Provider: office   I discussed the limitations, risks, security and privacy concerns of performing an evaluation and management service by telephone and the availability of in person appointments. I also discussed with the patient that there may be a patient responsible charge related to this service. The patient expressed understanding and agreed to proceed.   History of Present Illness:    Observations/Objective:   Assessment and Plan:   Follow Up Instructions:    I discussed the assessment and treatment plan with the patient. The patient was provided an opportunity to ask questions and all were answered. The patient agreed with the plan and demonstrated an understanding of the instructions.   The patient was advised to call back or seek an in-person evaluation if the symptoms worsen or if the condition fails to improve as anticipated.  I provided 22 minutes of non-face-to-face time during this encounter.   Photographs reviewed of patient's rather classic herpes zoster rash  Onset the last 2 days  No history of prior shingles vaccine  Review of Systems No headache no chest pain no shortness of breath    Objective:   Physical Exam  Virtual  Photographs observed with classic clusters of shingles eruption      Assessment & Plan:  Impression shingles plan local measures discussed.  Symptom care discussed.  Warning  signs discussed.  Valtrex 3 times daily 7 days side effects benefits discussed.  We will also send a shingles vaccine prescription

## 2019-08-19 ENCOUNTER — Telehealth: Payer: Self-pay | Admitting: Family Medicine

## 2019-08-19 NOTE — Telephone Encounter (Signed)
Contacted patient. Pt states his shingles are just about cleared up but is not sure if he needs to do anything else. Pt took last dose of med yesterday. No itching, no burning, no pain, no blisters. Please advise. Thank you

## 2019-08-19 NOTE — Telephone Encounter (Signed)
Pt contacted and verbalized understanding.  

## 2019-08-19 NOTE — Telephone Encounter (Signed)
Pt has finished his medicine for shingles. It has almost cleared up but not a 100% he would like to know what he should do now.   [facility] [place], [place] - [address]

## 2019-08-19 NOTE — Telephone Encounter (Signed)
Nurses Please let the patient know that the medical study showed that 7 days of the medication takes care of the issue.  He will should gradually heal up over the course of the next several days.  If he feels like it is getting worse for him to contact us right away. So therefore at this time we do not recommend more medication thank you

## 2019-10-04 ENCOUNTER — Ambulatory Visit: Payer: PPO | Admitting: Cardiology

## 2019-10-17 NOTE — Progress Notes (Signed)
Cardiology Office Note  Date: 10/18/2019   ID: Kevin Sweeney, DOB [birth year], MRN [id number]  PCP:  Kathyrn Drown, MD  Cardiologist:  Carlyle Dolly, MD Electrophysiologist:  None   Chief Complaint: F/U Abn. EKG, HTN, Chronic tachycardia, HLD, Chronic alcohol use, LE edema, Cardiomyopathy via recent echo  History of Present Illness: Kevin Sweeney is a 79 y.o. male with a history of Abn. EKG, HTN, Chronic tachycardia, HLD, Alcohol abuse, LE edema. Cardiomyopathy via recent echo   Last seen by Dr. Harl Bowie on 07/01/2019 for abnormal EKG, hypertension, chronic tachycardia, bil. lower extremity edema, chronic alcohol use.  Patient had no chest pain, shortness of breath, or dyspnea.  No palpitations.  His blood pressure was elevated at 182/76 at last visit.  He reported ongoing lower extremity edema for the previous 6 to 8 months. He reported significant white coat HTN. Coreg was considered. Avoid higher norvasc dose d/t to LE edema. Echocardiogram was ordered.  Echocardiogram on 07/14/2019 showed EF 30 to 35%.  Grade 1 DD, global hypokinesis, mild MR, moderate TR.  He is here for follow-up today.  He admits he has continued to drink significant amounts of alcohol in the form of vodka.  Also drinks beer.  He also continues to smoke cigars.  He denies any anginal or exertional symptoms.  His heart rate today is 84.  He admits some occasional lightheadedness but no presyncopal or syncopal episodes.  Blood pressure is elevated today at 160/80.  He brings with him a log of blood pressures ranging from early January 2 recently with blood pressures ranging anywhere from 94/61 to a high of 157/89.  He states his legs have been swelling since he started the amlodipine.    Past Medical History:  Diagnosis Date  . Hyperlipidemia   . Hypertension     Past Surgical History:  Procedure Laterality Date  . Cyst removed  1975    Current Outpatient Medications  Medication Sig Dispense Refill  .  ALPRAZolam (XANAX) 0.5 MG tablet TAKE ONE TABLET BY MOUTH AT BEDTIME AS NEEDED FOR ANXIETY OR SLEEP 30 tablet 4  . amLODipine (NORVASC) 2.5 MG tablet Take 1 tablet by mouth once daily 90 tablet 0  . carvedilol (COREG) 12.5 MG tablet Take 1 tablet (12.5 mg total) by mouth 2 (two) times daily. 180 tablet 1  . carvedilol (COREG) 6.25 MG tablet Take 1 tablet (6.25 mg total) by mouth 2 (two) times daily. 180 tablet 3  . doxazosin (CARDURA) 4 MG tablet TAKE 1 TABLET BY MOUTH ONCE DAILY EACH EVENING. 90 tablet 1  . fexofenadine (ALLEGRA) 180 MG tablet Take 180 mg by mouth daily.    . fluticasone (FLONASE) 50 MCG/ACT nasal spray USE TWO SPRAY(S) IN EACH NOSTRIL ONCE DAILY 16 g 5  . ibuprofen (ADVIL) 200 MG tablet Take 200 mg by mouth daily.    . Magnesium Oxide 400 (240 Mg) MG TABS 1 tablet daily (Patient taking differently: 2 tablet daily) 30 tablet 12  . OVER THE COUNTER MEDICATION Vit d 3 every day    . pantoprazole (PROTONIX) 40 MG tablet Take 1 tablet (40 mg total) by mouth daily. 90 tablet 1  . pravastatin (PRAVACHOL) 80 MG tablet Take 1 tablet (80 mg total) by mouth daily. 90 tablet 1  . Simethicone (GAS-X PO) Take by mouth. As needed    . valACYclovir (VALTREX) 1000 MG tablet One po tid times 7d , 21 tablet 0   No current facility-administered medications for  this visit.   Allergies:  Tetanus toxoids   Social History: The patient  reports that he has been smoking cigars and cigarettes. He started smoking about 64 years ago. He quit smokeless tobacco use about 4 years ago.   Family History: The patient's family history is not on file.   ROS:  Please see the history of present illness. Otherwise, complete review of systems is positive for none.  All other systems are reviewed and negative.   Physical Exam: VS:  Ht 5\' 10"  (1.778 m)   Wt 157 lb 9.6 oz (71.5 kg)   BMI 22.61 kg/m , BMI Body mass index is 22.61 kg/m.  Wt Readings from Last 3 Encounters:  10/18/19 157 lb 9.6 oz (71.5 kg)    07/01/19 154 lb 12.8 oz (70.2 kg)  03/31/19 170 lb (77.1 kg)    General: Patient appears comfortable at rest. Neck: Supple, no elevated JVP or carotid bruits, no thyromegaly. Lungs: Clear to auscultation, nonlabored breathing at rest. Cardiac: Regular rate and rhythm, no S3 or significant systolic murmur, no pericardial rub. Extremities: No pitting edema, distal pulses 2+. Skin: Warm and dry. Musculoskeletal: No kyphosis. Neuropsychiatric: Alert and oriented x3, affect grossly appropriate.  ECG: None today  Recent Labwork: 04/20/2019: ALT 58; AST 50 05/03/2019: BUN 12; Creatinine, Ser 1.83; Magnesium 1.0; Potassium 4.2; Sodium 136     Component Value Date/Time   CHOL 130 02/12/2019 1114   TRIG 129 02/12/2019 1114   HDL 84 02/12/2019 1114   CHOLHDL 1.5 02/12/2019 1114   CHOLHDL 3.8 05/05/2014 1158   VLDL 25 05/05/2014 1158   LDLCALC 20 02/12/2019 1114    Other Studies Reviewed Today:  Echocardiogram 07/14/2019  1. Left ventricular ejection fraction, by visual estimation, is 30 to 35% in the setting of ectopy and limited windows. The left ventricle has moderately decreased function. There is no left ventricular hypertrophy. Consider Definity contrast study. 2. Left ventricular diastolic parameters are consistent with Grade I diastolic dysfunction (impaired relaxation). 3. The left ventricle demonstrates global hypokinesis. 4. Global right ventricle has normal systolic function.The right ventricular size is normal. No increase in right ventricular wall thickness. 5. Left atrial size was normal. 6. Right atrial size was normal. 7. Presence of pericardial fat pad. 8. Mild mitral annular calcification. 9. The mitral valve is grossly normal. Mild mitral valve regurgitation. 10. The tricuspid valve is grossly normal. 11. The tricuspid valve is grossly normal. Tricuspid valve regurgitation is moderate. 12. The aortic valve has an indeterminant number of cusps. Aortic valve  regurgitation is mild. 13. The pulmonic valve was not well visualized. Pulmonic valve regurgitation is trivial. 14. Mildly elevated pulmonary artery systolic pressure. 15. The tricuspid regurgitant velocity is 3.03 m/s, and with an assumed right atrial pressure of 3 mmHg, the estimated right ventricular systolic pressure is mildly elevated at 39.7 mmHg. 16. The inferior vena cava is normal in size with greater than 50% respiratory variability, suggesting right atrial pressure of 3 mmHg.  Assessment and Plan:  1. Cardiomyopathy, unspecified type ([place])   2. Abnormal EKG   3. Bilateral lower extremity edema   4. Essential hypertension, benign   5. Sinus tachycardia     1. Cardiomyopathy, unspecified type ([place]) Recent echocardiogram in January showed EF of 30 to [id number] grade 1 diastolic dysfunction, left vent ventricular demonstrates hypokinesis globally, mild mitral regurgitation, moderate tricuspid regurgitation.  Mild RVSP 39.7 mmHg.  Start losartan 12.5 mg p.o. daily.  Stop amlodipine.  Get BMP and magnesium in 2  weeks.  We will get a Definity echo in 6 months to reassess EF.  Highly advised patient to significantly decrease the alcohol intake.  Advised him this may be contributing to cardiomyopathy. Continue carvedilol 12.5 mg p.o. twice daily.   2. Abnormal EKG  Last EKG on July 01, 2019 showed sinus tachycardia with occasional premature atrial complexes, right atrial enlargement, nonspecific ST and T wave abnormalities, heart rate of 124.  Heart rate today is 84 and regular.  3. Bilateral lower extremity edema Continues with chronic bilateral lower extremity edema.  Patient states his legs did not start swelling until he started the amlodipine.  Discontinue amlodipine  4. Essential hypertension, benign  He brings with him a log of blood pressures ranging from early January 2 recently with blood pressures ranging anywhere from 94/61 to a high of 157/89.  Start checking blood pressures  daily after starting losartan.  Come back in 2-weeks with a log of blood pressures for nursing visit.  We will send patient for BMP and magnesium at that time.  Advised him to stop smoking cigars as this may be a contributing factor to his hypertension.  5. Sinus tachycardia Heart rate is 84 today.  Patient denies any significant palpitations or rapid heart rates.  Medication Adjustments/Labs and Tests Ordered: Current medicines are reviewed at length with the patient today.  Concerns regarding medicines are outlined above.   Disposition: Follow-up with Dr Harl Bowie or APP 1 month Signed, Levell July, NP 10/18/2019 11:09 AM    [facility] [place], [place], [place] [address] Phone: [phone]; Fax: [phone]

## 2019-10-18 ENCOUNTER — Other Ambulatory Visit: Payer: Self-pay

## 2019-10-18 ENCOUNTER — Encounter: Payer: Self-pay | Admitting: Family Medicine

## 2019-10-18 ENCOUNTER — Ambulatory Visit: Payer: PPO | Admitting: Family Medicine

## 2019-10-18 VITALS — BP 160/80 | HR 84 | Ht 70.0 in | Wt 157.6 lb

## 2019-10-18 DIAGNOSIS — R000 Tachycardia, unspecified: Secondary | ICD-10-CM

## 2019-10-18 DIAGNOSIS — I429 Cardiomyopathy, unspecified: Secondary | ICD-10-CM

## 2019-10-18 DIAGNOSIS — R600 Localized edema: Secondary | ICD-10-CM | POA: Diagnosis not present

## 2019-10-18 DIAGNOSIS — I10 Essential (primary) hypertension: Secondary | ICD-10-CM

## 2019-10-18 DIAGNOSIS — R9431 Abnormal electrocardiogram [ECG] [EKG]: Secondary | ICD-10-CM | POA: Diagnosis not present

## 2019-10-18 MED ORDER — LOSARTAN POTASSIUM 25 MG PO TABS: 12.5000 mg | ORAL_TABLET | Freq: Every day | ORAL | 1 refills | Status: AC

## 2019-10-18 NOTE — Patient Instructions (Addendum)
Medication Instructions:   Your physician has recommended you make the following change in your medication:   Stop amlodipine  Start losartan 12.5 mg by mouth daily  Continue other medications the same  Labwork:  Your physician recommends that you return for non-fasting lab work in: 2 weeks to check your BMET & Mg levels. This can be done at [facility] or [facility] in [place]. No appointment needed-Monday-Friday from 8:00 am - 4:00 pm  Testing/Procedures: Your physician has requested that you have an echocardiogram with definity in 6 months (October 2021). Echocardiography is a painless test that uses sound waves to create images of your heart. It provides your doctor with information about the size and shape of your heart and how well your heart's chambers and valves are working. This procedure takes approximately one hour. There are no restrictions for this procedure.  Follow-Up:  Your physician recommends that you schedule a follow-up appointment in: 1 month (office).  Your physician recommends that you schedule a follow-up appointment in: 2 weeks for a nurse visit to have your blood pressure checked. Please bring your home blood pressure readings to the is visit.  Any Other Special Instructions Will Be Listed Below (If Applicable).  Your physician has requested that you regularly monitor and record your blood pressure readings at home. Please use the same machine at the same time of day to check your readings and record them to bring to your follow-up visit.  Please decrease your alcohol consumption.  Please stop smoking.   If you need a refill on your cardiac medications before your next appointment, please call your pharmacy.

## 2019-11-02 ENCOUNTER — Other Ambulatory Visit: Payer: Self-pay

## 2019-11-02 ENCOUNTER — Ambulatory Visit (INDEPENDENT_AMBULATORY_CARE_PROVIDER_SITE_OTHER): Payer: PPO | Admitting: *Deleted

## 2019-11-02 VITALS — BP 138/76 | HR 80

## 2019-11-02 DIAGNOSIS — I10 Essential (primary) hypertension: Secondary | ICD-10-CM | POA: Diagnosis not present

## 2019-11-02 NOTE — Progress Notes (Signed)
Patient in office for BP check - see vitals section.  Stated that he forgot his BP readings, but will mail back to office.

## 2019-11-03 NOTE — Progress Notes (Signed)
His blood pressure looks much better on the losartan.  Tell him to continue the current dosage

## 2019-11-05 NOTE — Progress Notes (Signed)
Patient notified and verbalized understanding. 

## 2019-11-09 DIAGNOSIS — Z7689 Persons encountering health services in other specified circumstances: Secondary | ICD-10-CM | POA: Diagnosis not present

## 2019-11-12 ENCOUNTER — Telehealth: Payer: Self-pay | Admitting: *Deleted

## 2019-11-12 NOTE — Telephone Encounter (Signed)
-----   Message from Verta Ellen., NP sent at 11/12/2019 10:48 AM EDT ----- Please disregard previous orders. Thank You

## 2019-11-12 NOTE — Telephone Encounter (Signed)
Patient informed and verbalized understanding of plan. 

## 2019-11-12 NOTE — Telephone Encounter (Signed)
Verta Ellen., NP  Merlene Laughter, RN  Hey Mr. Kevin Sweeney sent by mail a log of his recent blood pressures. Please call him and tell him that his blood pressures look great and tell him thank you for sending them in to [place]. Keep up the good work. Thanks

## 2019-11-12 NOTE — Telephone Encounter (Signed)
-----   Message from Verta Ellen., NP sent at 11/09/2019  9:28 AM EDT ----- Regarding: Vital signs sent in via mail by Sherie Don Mr. Kevin Sweeney sent by mail a log of his recent blood pressures.  Please call him and tell him that his blood pressures look great and tell him thank you for sending them in to [place].  Keep up the good work.  Thanks

## 2019-11-15 ENCOUNTER — Ambulatory Visit: Payer: PPO | Admitting: Family Medicine

## 2019-11-15 ENCOUNTER — Inpatient Hospital Stay (HOSPITAL_COMMUNITY)
Admission: EM | Admit: 2019-11-15 | Discharge: 2019-11-23 | DRG: 314 | Disposition: E | Payer: PPO | Attending: Internal Medicine | Admitting: Internal Medicine

## 2019-11-15 ENCOUNTER — Encounter (HOSPITAL_COMMUNITY): Payer: Self-pay

## 2019-11-15 ENCOUNTER — Inpatient Hospital Stay (HOSPITAL_COMMUNITY): Payer: PPO

## 2019-11-15 ENCOUNTER — Emergency Department (HOSPITAL_COMMUNITY): Payer: PPO

## 2019-11-15 DIAGNOSIS — I469 Cardiac arrest, cause unspecified: Secondary | ICD-10-CM | POA: Diagnosis present

## 2019-11-15 DIAGNOSIS — R7402 Elevation of levels of lactic acid dehydrogenase (LDH): Secondary | ICD-10-CM | POA: Diagnosis not present

## 2019-11-15 DIAGNOSIS — I110 Hypertensive heart disease with heart failure: Secondary | ICD-10-CM | POA: Diagnosis present

## 2019-11-15 DIAGNOSIS — I5042 Chronic combined systolic (congestive) and diastolic (congestive) heart failure: Secondary | ICD-10-CM | POA: Diagnosis present

## 2019-11-15 DIAGNOSIS — R0902 Hypoxemia: Secondary | ICD-10-CM | POA: Diagnosis not present

## 2019-11-15 DIAGNOSIS — F17210 Nicotine dependence, cigarettes, uncomplicated: Secondary | ICD-10-CM | POA: Diagnosis present

## 2019-11-15 DIAGNOSIS — J9602 Acute respiratory failure with hypercapnia: Secondary | ICD-10-CM | POA: Diagnosis present

## 2019-11-15 DIAGNOSIS — Y900 Blood alcohol level of less than 20 mg/100 ml: Secondary | ICD-10-CM | POA: Diagnosis present

## 2019-11-15 DIAGNOSIS — J9601 Acute respiratory failure with hypoxia: Secondary | ICD-10-CM | POA: Diagnosis present

## 2019-11-15 DIAGNOSIS — F17290 Nicotine dependence, other tobacco product, uncomplicated: Secondary | ICD-10-CM | POA: Diagnosis not present

## 2019-11-15 DIAGNOSIS — E785 Hyperlipidemia, unspecified: Secondary | ICD-10-CM | POA: Diagnosis not present

## 2019-11-15 DIAGNOSIS — E872 Acidosis, unspecified: Secondary | ICD-10-CM

## 2019-11-15 DIAGNOSIS — I700 Atherosclerosis of aorta: Secondary | ICD-10-CM | POA: Diagnosis not present

## 2019-11-15 DIAGNOSIS — I429 Cardiomyopathy, unspecified: Secondary | ICD-10-CM

## 2019-11-15 DIAGNOSIS — I4891 Unspecified atrial fibrillation: Secondary | ICD-10-CM | POA: Diagnosis present

## 2019-11-15 DIAGNOSIS — Z79899 Other long term (current) drug therapy: Secondary | ICD-10-CM | POA: Diagnosis not present

## 2019-11-15 DIAGNOSIS — N179 Acute kidney failure, unspecified: Secondary | ICD-10-CM | POA: Diagnosis present

## 2019-11-15 DIAGNOSIS — F1010 Alcohol abuse, uncomplicated: Secondary | ICD-10-CM | POA: Diagnosis present

## 2019-11-15 DIAGNOSIS — J690 Pneumonitis due to inhalation of food and vomit: Secondary | ICD-10-CM | POA: Diagnosis present

## 2019-11-15 DIAGNOSIS — R600 Localized edema: Secondary | ICD-10-CM | POA: Diagnosis not present

## 2019-11-15 DIAGNOSIS — J439 Emphysema, unspecified: Secondary | ICD-10-CM | POA: Diagnosis not present

## 2019-11-15 DIAGNOSIS — I959 Hypotension, unspecified: Secondary | ICD-10-CM | POA: Diagnosis not present

## 2019-11-15 DIAGNOSIS — I484 Atypical atrial flutter: Secondary | ICD-10-CM | POA: Diagnosis present

## 2019-11-15 DIAGNOSIS — R54 Age-related physical debility: Secondary | ICD-10-CM | POA: Diagnosis present

## 2019-11-15 DIAGNOSIS — R0602 Shortness of breath: Secondary | ICD-10-CM | POA: Diagnosis not present

## 2019-11-15 DIAGNOSIS — I428 Other cardiomyopathies: Secondary | ICD-10-CM | POA: Diagnosis not present

## 2019-11-15 DIAGNOSIS — R410 Disorientation, unspecified: Secondary | ICD-10-CM | POA: Diagnosis not present

## 2019-11-15 DIAGNOSIS — E860 Dehydration: Secondary | ICD-10-CM | POA: Diagnosis present

## 2019-11-15 DIAGNOSIS — I462 Cardiac arrest due to underlying cardiac condition: Secondary | ICD-10-CM | POA: Diagnosis present

## 2019-11-15 DIAGNOSIS — I499 Cardiac arrhythmia, unspecified: Secondary | ICD-10-CM | POA: Diagnosis not present

## 2019-11-15 DIAGNOSIS — Z7982 Long term (current) use of aspirin: Secondary | ICD-10-CM

## 2019-11-15 DIAGNOSIS — M6250 Muscle wasting and atrophy, not elsewhere classified, unspecified site: Secondary | ICD-10-CM | POA: Diagnosis present

## 2019-11-15 DIAGNOSIS — R778 Other specified abnormalities of plasma proteins: Secondary | ICD-10-CM | POA: Diagnosis not present

## 2019-11-15 DIAGNOSIS — Z20822 Contact with and (suspected) exposure to covid-19: Secondary | ICD-10-CM | POA: Diagnosis not present

## 2019-11-15 DIAGNOSIS — R4020 Unspecified coma: Secondary | ICD-10-CM | POA: Diagnosis not present

## 2019-11-15 HISTORY — DX: Cardiomyopathy, unspecified: I42.9

## 2019-11-15 HISTORY — DX: Alcohol abuse, uncomplicated: F10.10

## 2019-11-15 HISTORY — DX: Tachycardia, unspecified: R00.0

## 2019-11-15 LAB — TROPONIN I (HIGH SENSITIVITY)
Troponin I (High Sensitivity): 65 ng/L — ABNORMAL HIGH (ref <18)
Troponin I (High Sensitivity): 59 ng/L — ABNORMAL HIGH (ref <18)

## 2019-11-15 LAB — PROCALCITONIN: Procalcitonin: 0.19 ng/mL

## 2019-11-15 LAB — CBC
HCT: 38.6 % — ABNORMAL LOW (ref 39.0–52.0)
Hemoglobin: 11.5 g/dL — ABNORMAL LOW (ref 13.0–17.0)
MCH: 36.3 pg — ABNORMAL HIGH (ref 26.0–34.0)
MCHC: 29.8 g/dL — ABNORMAL LOW (ref 30.0–36.0)
MCV: 121.8 fL — ABNORMAL HIGH (ref 80.0–100.0)
Platelets: 222 10*3/uL (ref 150–400)
RBC: 3.17 MIL/uL — ABNORMAL LOW (ref 4.22–5.81)
RDW: 15.1 % (ref 11.5–15.5)
WBC: 18.6 10*3/uL — ABNORMAL HIGH (ref 4.0–10.5)
nRBC: 3.5 % — ABNORMAL HIGH (ref 0.0–0.2)

## 2019-11-15 LAB — COMPREHENSIVE METABOLIC PANEL
ALT: 51 U/L — ABNORMAL HIGH (ref 0–44)
AST: 56 U/L — ABNORMAL HIGH (ref 15–41)
Albumin: 2.6 g/dL — ABNORMAL LOW (ref 3.5–5.0)
Alkaline Phosphatase: 62 U/L (ref 38–126)
Anion gap: 16 — ABNORMAL HIGH (ref 5–15)
BUN: 56 mg/dL — ABNORMAL HIGH (ref 8–23)
CO2: 26 mmol/L (ref 22–32)
Calcium: 8.1 mg/dL — ABNORMAL LOW (ref 8.9–10.3)
Chloride: 103 mmol/L (ref 98–111)
Creatinine, Ser: 3.38 mg/dL — ABNORMAL HIGH (ref 0.61–1.24)
GFR calc Af Amer: 19 mL/min — ABNORMAL LOW (ref >60)
GFR calc non Af Amer: 17 mL/min — ABNORMAL LOW (ref >60)
Glucose, Bld: 97 mg/dL (ref 70–99)
Potassium: 4.5 mmol/L (ref 3.5–5.1)
Sodium: 145 mmol/L (ref 135–145)
Total Bilirubin: 1.3 mg/dL — ABNORMAL HIGH (ref 0.3–1.2)
Total Protein: 5.5 g/dL — ABNORMAL LOW (ref 6.5–8.1)

## 2019-11-15 LAB — BLOOD GAS, ARTERIAL
Acid-base deficit: 3 mmol/L — ABNORMAL HIGH (ref 0.0–2.0)
Acid-base deficit: 6.7 mmol/L — ABNORMAL HIGH (ref 0.0–2.0)
Bicarbonate: 21.2 mmol/L (ref 20.0–28.0)
Bicarbonate: 18.1 mmol/L — ABNORMAL LOW (ref 20.0–28.0)
FIO2: 80
FIO2: 40
O2 Saturation: 99 %
O2 Saturation: 91 %
Patient temperature: 37
Patient temperature: 36.7
pCO2 arterial: 52.5 mmHg — ABNORMAL HIGH (ref 32.0–48.0)
pCO2 arterial: 51.9 mmHg — ABNORMAL HIGH (ref 32.0–48.0)
pH, Arterial: 7.264 — ABNORMAL LOW (ref 7.350–7.450)
pH, Arterial: 7.209 — ABNORMAL LOW (ref 7.350–7.450)
pO2, Arterial: 248 mmHg — ABNORMAL HIGH (ref 83.0–108.0)
pO2, Arterial: 84.5 mmHg (ref 83.0–108.0)

## 2019-11-15 LAB — CBC WITH DIFFERENTIAL/PLATELET
Abs Immature Granulocytes: 0.21 10*3/uL — ABNORMAL HIGH (ref 0.00–0.07)
Basophils Absolute: 0 10*3/uL (ref 0.0–0.1)
Basophils Relative: 0 %
Eosinophils Absolute: 0 10*3/uL (ref 0.0–0.5)
Eosinophils Relative: 0 %
HCT: 41.2 % (ref 39.0–52.0)
Hemoglobin: 12.4 g/dL — ABNORMAL LOW (ref 13.0–17.0)
Immature Granulocytes: 1 %
Lymphocytes Relative: 23 %
Lymphs Abs: 3.8 10*3/uL (ref 0.7–4.0)
MCH: 35.9 pg — ABNORMAL HIGH (ref 26.0–34.0)
MCHC: 30.1 g/dL (ref 30.0–36.0)
MCV: 119.4 fL — ABNORMAL HIGH (ref 80.0–100.0)
Monocytes Absolute: 1.1 10*3/uL — ABNORMAL HIGH (ref 0.1–1.0)
Monocytes Relative: 7 %
Neutro Abs: 11.3 10*3/uL — ABNORMAL HIGH (ref 1.7–7.7)
Neutrophils Relative %: 69 %
Platelets: 233 10*3/uL (ref 150–400)
RBC: 3.45 MIL/uL — ABNORMAL LOW (ref 4.22–5.81)
RDW: 15.1 % (ref 11.5–15.5)
WBC: 16.5 10*3/uL — ABNORMAL HIGH (ref 4.0–10.5)
nRBC: 3 % — ABNORMAL HIGH (ref 0.0–0.2)

## 2019-11-15 LAB — MAGNESIUM: Magnesium: 1.7 mg/dL (ref 1.7–2.4)

## 2019-11-15 LAB — BASIC METABOLIC PANEL
Anion gap: 15 (ref 5–15)
BUN: 55 mg/dL — ABNORMAL HIGH (ref 8–23)
CO2: 23 mmol/L (ref 22–32)
Calcium: 7.1 mg/dL — ABNORMAL LOW (ref 8.9–10.3)
Chloride: 106 mmol/L (ref 98–111)
Creatinine, Ser: 3.19 mg/dL — ABNORMAL HIGH (ref 0.61–1.24)
GFR calc Af Amer: 21 mL/min — ABNORMAL LOW (ref >60)
GFR calc non Af Amer: 18 mL/min — ABNORMAL LOW (ref >60)
Glucose, Bld: 124 mg/dL — ABNORMAL HIGH (ref 70–99)
Potassium: 4.7 mmol/L (ref 3.5–5.1)
Sodium: 144 mmol/L (ref 135–145)

## 2019-11-15 LAB — LACTIC ACID, PLASMA
Lactic Acid, Venous: 3.5 mmol/L (ref 0.5–1.9)
Lactic Acid, Venous: 2.3 mmol/L (ref 0.5–1.9)
Lactic Acid, Venous: 3.3 mmol/L (ref 0.5–1.9)
Lactic Acid, Venous: 3.1 mmol/L (ref 0.5–1.9)

## 2019-11-15 LAB — SARS CORONAVIRUS 2 BY RT PCR (HOSPITAL ORDER, PERFORMED IN ~~LOC~~ HOSPITAL LAB): SARS Coronavirus 2: NEGATIVE

## 2019-11-15 LAB — POC SARS CORONAVIRUS 2 AG -  ED: SARS Coronavirus 2 Ag: NEGATIVE

## 2019-11-15 LAB — ETHANOL: Alcohol, Ethyl (B): <10 mg/dL (ref <10)

## 2019-11-15 MED ORDER — SODIUM CHLORIDE 0.9 % IV BOLUS
1000.0000 mL | Freq: Once | INTRAVENOUS | Status: AC
  Administered 2019-11-15: 1000 mL via INTRAVENOUS

## 2019-11-15 MED ORDER — NOREPINEPHRINE 4 MG/250ML-% IV SOLN
INTRAVENOUS | Status: AC
  Administered 2019-11-15: 5 ug/min
  Filled 2019-11-15: qty 250

## 2019-11-15 MED ORDER — HEPARIN (PORCINE) 25000 UT/250ML-% IV SOLN
1250.0000 [IU]/h | INTRAVENOUS | Status: DC
  Administered 2019-11-15: 1250 [IU]/h via INTRAVENOUS
  Filled 2019-11-15: qty 250

## 2019-11-15 MED ORDER — EPINEPHRINE 1 MG/10ML IJ SOSY
0.1000 mg | PREFILLED_SYRINGE | Freq: Once | INTRAMUSCULAR | Status: AC
  Administered 2019-11-15: 1 mg via INTRAVENOUS

## 2019-11-15 MED ORDER — PIPERACILLIN-TAZOBACTAM 3.375 G IVPB
3.3750 g | Freq: Once | INTRAVENOUS | Status: AC
  Administered 2019-11-15: 3.375 g via INTRAVENOUS
  Filled 2019-11-15: qty 50

## 2019-11-15 MED ORDER — AMIODARONE HCL IN DEXTROSE 360-4.14 MG/200ML-% IV SOLN
30.0000 mg/h | INTRAVENOUS | Status: DC
  Administered 2019-11-15: 30 mg/h via INTRAVENOUS
  Filled 2019-11-15: qty 200

## 2019-11-15 MED ORDER — LACTATED RINGERS IV SOLN: INTRAVENOUS | Status: DC

## 2019-11-15 MED ORDER — HEPARIN BOLUS VIA INFUSION
4000.0000 [IU] | Freq: Once | INTRAVENOUS | Status: AC
  Administered 2019-11-15: 4000 [IU] via INTRAVENOUS

## 2019-11-15 MED ORDER — AMIODARONE HCL IN DEXTROSE 360-4.14 MG/200ML-% IV SOLN
60.0000 mg/h | INTRAVENOUS | Status: AC
  Administered 2019-11-15: 60 mg/h via INTRAVENOUS
  Filled 2019-11-15: qty 200

## 2019-11-15 MED ORDER — NOREPINEPHRINE 4 MG/250ML-% IV SOLN
INTRAVENOUS | Status: AC
  Filled 2019-11-15: qty 250

## 2019-11-15 MED ORDER — VANCOMYCIN HCL 1500 MG/300ML IV SOLN
1500.0000 mg | Freq: Once | INTRAVENOUS | Status: AC
  Administered 2019-11-15: 1500 mg via INTRAVENOUS
  Filled 2019-11-15: qty 300

## 2019-11-15 MED ORDER — VANCOMYCIN HCL 750 MG/150ML IV SOLN: 750.0000 mg | INTRAVENOUS | Status: DC

## 2019-11-15 MED ORDER — EPINEPHRINE 1 MG/10ML IJ SOSY
0.1000 mg | PREFILLED_SYRINGE | Freq: Once | INTRAMUSCULAR | Status: AC
  Administered 2019-11-15: 0.1 mg via INTRAVENOUS

## 2019-11-15 NOTE — ED Notes (Signed)
In and out cath pt, able to get small amount of urine, not enough urine to do urinalysis but able to do urine culture.  Will need more urine sent to lab for urinalysis.

## 2019-11-15 NOTE — ED Notes (Signed)
Nurse asked me to page hospitalist due to BP 55/33. Pt talking at this time. Awaiting return call

## 2019-11-15 NOTE — Progress Notes (Addendum)
Responded to nursing call:  Pt lost pulse--chest compressions.  HR had been in 130s, rapidly went to 60s and became hypotensive and lost pulse and became unresponsive.  Chest compressions and epi given under direction of Dr. Sedonia Small, ED MD.  Pt awake on my arrival--denies cp, sob, headache, abd pain.  Pt already received vanc and zosyn. HR in 130-140s in afib RVR.  Pt then became hypotensive again with SBP in 70s.  Requested Dr. Sedonia Small place central line which was done and pt started on levophed.  Updated son on phone.  Had goals of care discussion.   Discussed overall poor prognosis given pt's cardiomyopathy with EF 30-35%, COPD, prolonged and continued Etoh and tobacco abuse and now with AKI and septic picture.  Discussed likelihood of patient making full recovery to pre-morbid condition is very low if he is to suffer another or repeated cardiopulmonary arrest(s). Son stated "I just can't handle this right now.  I can't come in to see him.  I don't want him to suffer." After further discussion, son ultimately felt it was in patient's best interest to change his code status to DNR/DNI.  Son stated if pt patient did not stabilize and/or continued to decline, would likely transition to comfort measures      Vitals:   11/20/2019 1905 11/04/2019 1911 11/09/2019 1915 11/15/19 1920  BP: (!) 65/36 (!) 76/60 (!) 73/62 (!) 74/58  Pulse:      Resp: (!) 30 (!) 29 (!) 27 (!) 21  Temp:      TempSrc:      SpO2:      Weight:      Height:       CV--IRRR Lung--scattered bilateral rales Abd--soft+BS/NT   Assessment/Plan: SIRS -likely aspiration -continue vanc/zosyn -follow cultures -rebolus LR 1 L, then continue LR @75  -titrate levophed for SBP>90 -ABG--7.20/51/84/18 (0.4) Aflutter RVR -continue amio -continue IV heparin Cardiac arrest -repeat BMP, mag, CBC -repeat lactate -check PCT -personally reviewed CXR at 1838--no consolidation or edema -gave sign out to Dr. Tennis Must and updated him on  patient  Total time spent 45 minutes.  Greater than 50% spent face to face counseling and coordinating care.    Orson Eva, DO [facility]

## 2019-11-15 NOTE — Consult Note (Addendum)
Cardiology Consultation:   Patient ID: Kevin ENERSON MRN: [id number]; DOB: [birth year]  Admit date: 11/02/2019 Date of Consult: 11/13/2019  Primary Care Provider: Kathyrn Drown, MD Primary Cardiologist: Carlyle Dolly, MD  Primary Electrophysiologist:  None    Patient Profile:   Kevin Sweeney is a 79 y.o. male with a history of abn EKG, HTN, tachycardia, ETOH, and cardiomyopathy who is being seen today for the evaluation of atrial flutter at the request of Dr. Eulis Foster.  History of Present Illness:   Mr. Elgart with history of HTN, chronic sinus tachycardia, ETOH, who Dr. Harl Bowie saw 07/01/19 for abn EKG-nonspecific ST/T changes and echo 07/14/19 LVEF 30-35% grade 1DD global HK, mild MR, mod TR and coreg increased. Plan to optimize medical therapy repeat echo with contrast and if still low get ischemic evaluation  He was seen in f/u by Levell July, NP 10/18/19 and was drinking significant amts of vodka and beer. He was started on losartan 12.5 mg daily and amlodipine stopped because of leg swelling.  Today the sheriff's office did a welfare check on him because he would not answer his door this morning.  EMS was transporting him he had sudden loss of pulses and decreased breathing.  He received external cardiac manual compressions with recovery of pulse within 1 minute.  He was given assisted ventilation by bag valve mask.  During transport he was noted to be in atrial fibrillation with a heart rate of 170 bpm.  He was not responsive during transport but became responsive in the emergency room but unable to communicate verbally.  Caregiver said the patient's been drinking a lot of alcohol since his wife's death a year ago. Patient alert but confused. Son at bedside. Says his father has been sleeping odd hours and confused on day/time for a couple of weeks. Caregiver checks on him Mon/Wed/Fri. And says he goes to the liquor store daily and buys 1 gallon of vodka and beer. Didn't respond yest or today  and sheriff found him in bed confused.  Not clear that he has been eating regularly as well.  Creatinine 3.38 (was 1.23 11/09/2019), albumin low LFTs elevated bilirubin elevated gas at 3.5, troponin 65, WBC 16.5, alcohol level not detected, chest x-ray probable emphysema no acute pulmonary findings, EKG atrial flutter at 174 bpm nonspecific ST-T changes.   Past Medical History:  Diagnosis Date  . Alcohol abuse   . Hyperlipidemia   . Hypertension   . Secondary cardiomyopathy ([place])   . Tachycardia     Past Surgical History:  Procedure Laterality Date  . Cyst removed  1975     Home Medications:  Prior to Admission medications   Medication Sig Start Date End Date Taking? Authorizing Provider  ALPRAZolam Duanne Moron) 0.5 MG tablet TAKE ONE TABLET BY MOUTH AT BEDTIME AS NEEDED FOR ANXIETY OR SLEEP 03/03/19   Kathyrn Drown, MD  aspirin EC 81 MG tablet Take 81 mg by mouth daily.    [provider]  carvedilol (COREG) 12.5 MG tablet Take 1 tablet (12.5 mg total) by mouth 2 (two) times daily. 07/22/19 10/20/19  Arnoldo Lenis, MD  doxazosin (CARDURA) 4 MG tablet TAKE 1 TABLET BY MOUTH ONCE DAILY EACH EVENING. 03/03/19   Kathyrn Drown, MD  fexofenadine (ALLEGRA) 180 MG tablet Take 180 mg by mouth daily.    [provider]  fluticasone (FLONASE) 50 MCG/ACT nasal spray USE TWO SPRAY(S) IN EACH NOSTRIL ONCE DAILY 01/09/18   Kathyrn Drown, MD  ibuprofen (ADVIL) 200 MG tablet Take 200 mg by mouth daily.    [provider]  losartan (COZAAR) 25 MG tablet Take 0.5 tablets (12.5 mg total) by mouth daily. 10/18/19 01/16/20  Verta Ellen., NP  Magnesium Oxide 400 (240 Mg) MG TABS 1 tablet daily Patient taking differently: 2 tablet daily 05/11/19   Luking, Elayne Snare, MD  OVER THE COUNTER MEDICATION Vit d 3 every day    [provider]  pantoprazole (PROTONIX) 40 MG tablet Take 1 tablet (40 mg total) by mouth daily. 03/03/19   Kathyrn Drown, MD  pravastatin (PRAVACHOL)  80 MG tablet Take 1 tablet (80 mg total) by mouth daily. 03/03/19   Kathyrn Drown, MD  Simethicone (GAS-X PO) Take by mouth. As needed    [provider]    Outpatient Medications: No current facility-administered medications on file prior to encounter.   Current Outpatient Medications on File Prior to Encounter  Medication Sig Dispense Refill  . ALPRAZolam (XANAX) 0.5 MG tablet TAKE ONE TABLET BY MOUTH AT BEDTIME AS NEEDED FOR ANXIETY OR SLEEP (Patient taking differently: Take 0.5 mg by mouth at bedtime as needed for anxiety or sleep. ) 30 tablet 4  . amLODipine (NORVASC) 2.5 MG tablet Take 2.5 mg by mouth daily.    Marland Kitchen aspirin EC 81 MG tablet Take 81 mg by mouth daily.    . carvedilol (COREG) 12.5 MG tablet Take 1 tablet (12.5 mg total) by mouth 2 (two) times daily. 180 tablet 1  . doxazosin (CARDURA) 4 MG tablet TAKE 1 TABLET BY MOUTH ONCE DAILY EACH EVENING. (Patient taking differently: Take 4 mg by mouth every evening. ) 90 tablet 1  . fexofenadine (ALLEGRA) 180 MG tablet Take 180 mg by mouth daily.    . fluticasone (FLONASE) 50 MCG/ACT nasal spray USE TWO SPRAY(S) IN EACH NOSTRIL ONCE DAILY (Patient taking differently: Place 2 sprays into both nostrils daily as needed for allergies or rhinitis. ) 16 g 5  . ibuprofen (ADVIL) 200 MG tablet Take 200 mg by mouth daily.    Marland Kitchen losartan (COZAAR) 25 MG tablet Take 0.5 tablets (12.5 mg total) by mouth daily. 45 tablet 1  . Magnesium Oxide 400 (240 Mg) MG TABS 1 tablet daily (Patient taking differently: Take 2 tablets by mouth daily. ) 30 tablet 12  . pantoprazole (PROTONIX) 40 MG tablet Take 1 tablet (40 mg total) by mouth daily. 90 tablet 1  . pravastatin (PRAVACHOL) 80 MG tablet Take 1 tablet (80 mg total) by mouth daily. 90 tablet 1  . Simethicone (GAS-X PO) Take 1 capsule by mouth daily as needed (for gas relief). As needed       Allergies:    Allergies  Allergen Reactions  . Tetanus Toxoids Hives and Swelling    Social History:    Social History   Socioeconomic History  . Marital status: Married    Spouse name: Not on file  . Number of children: Not on file  . Years of education: Not on file  . Highest education level: Not on file  Occupational History  . Not on file  Tobacco Use  . Smoking status: Current Some Day Smoker    Types: Cigars, Cigarettes    Start date: 02/06/1955  . Smokeless tobacco: Former Systems developer    Quit date: 09/23/2015  Substance and Sexual Activity  . Alcohol use: Yes    Alcohol/week: 0.0 standard drinks    Comment: heavy  . Drug use: Not Currently  .  Sexual activity: Not on file  Other Topics Concern  . Not on file  Social History Narrative  . Not on file   Social Determinants of Health   Financial Resource Strain:   . Difficulty of Paying Living Expenses:   Food Insecurity:   . Worried About Charity fundraiser in the Last Year:   . Arboriculturist in the Last Year:   Transportation Needs:   . Film/video editor (Medical):   Marland Kitchen Lack of Transportation (Non-Medical):   Physical Activity:   . Days of Exercise per Week:   . Minutes of Exercise per Session:   Stress:   . Feeling of Stress :   Social Connections:   . Frequency of Communication with Friends and Family:   . Frequency of Social Gatherings with Friends and Family:   . Attends Religious Services:   . Active Member of Clubs or Organizations:   . Attends Archivist Meetings:   Marland Kitchen Marital Status:   Intimate Partner Violence:   . Fear of Current or Ex-Partner:   . Emotionally Abused:   Marland Kitchen Physically Abused:   . Sexually Abused:     Family History:      Family History  Family history unknown: Yes     ROS:  Please see the history of present illness.  Review of Systems  Reason unable to perform ROS: confused.   All other ROS reviewed and negative.     Physical Exam/Data:   Vitals:   10/28/2019 1515 11/07/2019 1518 11/14/2019 1530 11/01/2019 1545  BP: 98/69   (!) 96/59  Pulse:   (!) 31   Resp: (!) 26  (!) 24 (!) 28 (!) 36  Temp:      TempSrc:      SpO2:   100%   Weight:      Height:        Intake/Output Summary (Last 24 hours) at 10/29/2019 1613 Last data filed at 11/06/2019 1542 Gross per 24 hour  Intake 2000 ml  Output --  Net 2000 ml   Last 3 Weights 11/11/2019 10/18/2019 07/01/2019  Weight (lbs) 175 lb 157 lb 9.6 oz 154 lb 12.8 oz  Weight (kg) 79.379 kg 71.487 kg 70.217 kg     Body mass index is 21.3 kg/m.  General:  Thin, elderly, mildly short of breath. HEENT: normal Lymph: no adenopathy Neck: no JVD Endocrine:  No thryomegaly Vascular: No carotid bruits; FA pulses 2+ bilaterally without bruits  Cardiac: Rapid regular rhythm; no murmur   Lungs:  Decreased breath sounds with scattered rhonchi Abd: soft, nontender, no hepatomegaly  Ext: no edema Musculoskeletal:  No deformities, BUE and BLE strength normal and equal Skin: warm and dry  Neuro:  confused Psych:  Normal affect   EKG:  The EKG was peronally reviewed and demonstrates: Atrial flutter at 174 bpm nonspecific ST changes Telemetry:  Telemetry was personally reviewed and demonstrates:  Atrial flutter 145/m  Relevant CV Studies: 2D echo 07/14/2019 IMPRESSIONS     1. Left ventricular ejection fraction, by visual estimation, is 30 to 35%  in the setting of ectopy and limited windows. The left ventricle has  moderately decreased function. There is no left ventricular hypertrophy.  Consider Definity contrast study.   2. Left ventricular diastolic parameters are consistent with Grade I  diastolic dysfunction (impaired relaxation).   3. The left ventricle demonstrates global hypokinesis.   4. Global right ventricle has normal systolic function.The right  ventricular size is normal.  No increase in right ventricular wall  thickness.   5. Left atrial size was normal.   6. Right atrial size was normal.   7. Presence of pericardial fat pad.   8. Mild mitral annular calcification.   9. The mitral valve is grossly  normal. Mild mitral valve regurgitation.  10. The tricuspid valve is grossly normal.  11. The tricuspid valve is grossly normal. Tricuspid valve regurgitation  is moderate.  12. The aortic valve has an indeterminant number of cusps. Aortic valve  regurgitation is mild.  13. The pulmonic valve was not well visualized. Pulmonic valve  regurgitation is trivial.  14. Mildly elevated pulmonary artery systolic pressure.  15. The tricuspid regurgitant velocity is 3.03 m/s, and with an assumed  right atrial pressure of 3 mmHg, the estimated right ventricular systolic  pressure is mildly elevated at 39.7 mmHg.  16. The inferior vena cava is normal in size with greater than 50%  respiratory variability, suggesting right atrial pressure of 3 mmHg.    Laboratory Data:  High Sensitivity Troponin:   Recent Labs  Lab 10/27/2019 1232  TROPONINIHS 65*     Chemistry Recent Labs  Lab 10/28/2019 1232  NA 145  K 4.5  CL 103  CO2 26  GLUCOSE 97  BUN 56*  CREATININE 3.38*  CALCIUM 8.1*  GFRNONAA 17*  GFRAA 19*  ANIONGAP 16*    Recent Labs  Lab 11/05/2019 1232  PROT 5.5*  ALBUMIN 2.6*  AST 56*  ALT 51*  ALKPHOS 62  BILITOT 1.3*   Hematology Recent Labs  Lab 11/06/2019 1232  WBC 16.5*  RBC 3.45*  HGB 12.4*  HCT 41.2  MCV 119.4*  MCH 35.9*  MCHC 30.1  RDW 15.1  PLT 233    Radiology/Studies:  DG Chest Port 1 View  Result Date: 10/27/2019 CLINICAL DATA:  Shortness of breath. EXAM: PORTABLE CHEST 1 VIEW COMPARISON:  None. FINDINGS: The heart is normal in size. There is mild tortuosity and calcification of the thoracic aorta. The pulmonary hila appear normal. The lungs demonstrate mild hyperinflation with probable emphysematous changes. No infiltrates, edema or effusions. The bony thorax is intact. IMPRESSION: Mild hyperinflation and probable emphysematous changes. No acute pulmonary findings. Electronically Signed   By: Marijo Sanes M.D.   On: 10/24/2019 13:00    Assessment and  Plan:   1. Reported pulseless arrest during EMS transport requiring limited CPR for approximately 1 minute en route to ER via EMS.  Patient reported to be in rapid atrial fibrillation during transport with relative hypotension.  Troponin 65. EKG consistent with atypical atrial flutter with RVR.  He has been in bed for at least 1-2 days, not eating regularly, likely dehydrated. 2. Atypical atrial flutter with RVR, started on amiodarone in ER-BP too low to use other meds. 3. History of sinus tachycardia-chronic. 4. Cardiomyopathy ejection fraction 30 to 35% on echo 07/14/2019 on carvedilol and recently started on losartan 12.5 mg daily. 5. Essential hypertension-hypotensive has not. 6. EtOH-drinks a lot of vodka and beer daily per son and caregiver but hasn't had any in past 1-2 days because of being in bed. 7. AKI-Crt 3.38 getting fluids. Hold losartan(just started and Crt 1.23 11/09/19. 8. Lactic acid 3.5 and  2.3. 9. Elevated WBC per hospitalist team.   For questions or updates, please contact [place] Please consult [email or web address] for contact info under    Signed, Ermalinda Barrios PA-C 11/12/2019 4:13 PM   Attending note:  Patient seen and examined.  I reviewed  his records and discussed the case with Ms. Bonnell Public PA-C as well as the patient's son at bedside.  Mr. Borenstein was brought to the ER via EMS in the situation discussed above.  It is not clear that he has eaten or taken in fluids regularly over the last few days, not answering his door and presumably in bed during this time.  He has a significant history of regular alcohol intake at baseline, secondary cardiomyopathy with LVEF 30 to 35% range as of January, evidence of acute renal failure with creatinine up to 3.38 potentially in the setting of intravascular volume depletion, but also recent initiation of losartan in April.  Patient does not provide much history at this time but does answer questions.  He does not report any chest pain or  sense of palpitations, no abdominal pain.  Son indicates that he was told that the patient's caregiver may have had some hematuria in his urinal.  He is noted to be in rapid atypical atrial flutter at this time, duration uncertain.  On examination patient appears chronically ill, mildly short of breath, confused as to the situation but does answer questions.  Lungs exhibit coarse but decreased breath sounds.  Cardiac exam with rapid and largely regular rhythm, unable to auscultate S3, no rub.  Abdomen is soft, extremities show evidence of muscle wasting and no pitting edema.  Lab work includes potassium 4.5, BUN 56, creatinine 3.38, AST 56, ALT 51, hemoglobin 12.4, WBC 16.5, platelets 233, lactic acid 3.5 down to 2.3, high-sensitivity troponin I levels 65 down to 59 SARS coronavirus 2 test negative, EtOH level less than 10, blood culture sent and pending.  Chest x-ray shows emphysematous changes with no obvious acute findings.  Patient is being admitted to the hospitalist service for further evaluation.  At the present time he is in atypical atrial flutter of uncertain duration.  Details of his situation over the last few days are not clear, it is possible that he has been in this rhythm with relative hypotension resulting in decreased functional capacity with intravascular volume depletion and acute renal failure.  He has been started on IV amiodarone for now, would also start IV heparin with CHA2DS2-VASc score of at least 4.  Agree with gentle hydration for now.  Proceed with alcohol withdrawal protocol.  Hold Coreg and losartan.  Follow-up blood cultures, he is being started on empiric antibiotics by primary team.  Echocardiogram can be obtained once heart rate has been better stabilized.  Prognosis is guarded.  Satira Sark, M.D., F.A.C.C.

## 2019-11-15 NOTE — Progress Notes (Signed)
Pharmacy Antibiotic Note  Kevin Sweeney is a 79 y.o. male admitted on 11/05/2019 with sepsis.  Pharmacy has been consulted for vancomycin dosing.  Plan: Vancomycin 750mg  IV every 24 hours.  Goal trough 15-20 mcg/mL.  Height: 6\' 4"  (193 cm) Weight: 79.4 kg (175 lb) IBW/kg (Calculated) : 86.8  Temp (24hrs), Avg:98.8 F (37.1 C), Min:98.8 F (37.1 C), Max:98.8 F (37.1 C)  Recent Labs  Lab 11/13/2019 1232 11/02/2019 1233 11/12/2019 1406  WBC 16.5*  --   --   CREATININE 3.38*  --   --   LATICACIDVEN  --  3.5* 2.3*    Estimated Creatinine Clearance: 20.9 mL/min (A) (by C-G formula based on SCr of 3.38 mg/dL (H)).    Allergies  Allergen Reactions  . Tetanus Toxoids Hives and Swelling    Antimicrobials this admission: 5/24 zosyn x 1 5/24 vancomycin >>  Microbiology results: 5/24 BCx: sent 5/24 Covid: negative 5/24 UCx: sent  Thank you for allowing pharmacy to be a part of this patient's care.  Donna Christen Coffee 11/11/2019 4:02 PM

## 2019-11-15 NOTE — ED Triage Notes (Addendum)
EMS responded to a well check. Pt was in bed and walked with assistance to truck. Pt very SOB at time. Iniitial Sys BP 80's. Once in truck eyes rolled back and lost pulse. Chest compressions for 1 minute and return of pulses. Pt being ventilated en route. Pt has not gotten out of bed in several days and has been drinking heavily. Pt is coughing and congested

## 2019-11-15 NOTE — H&P (Signed)
History and Physical  Kevin Sweeney [id number] DOB: September 08, 1940 DOA: 10/26/2019   PCP: Kathyrn Drown, MD   Patient coming from: Home  Chief Complaint: generalized weakness, cardiac arrest  HPI:  Kevin Sweeney is a 79 y.o. male with medical history of COPD, tobacco abuse, hypertension, systolic and diastolic CHF EF [id number], and hyperlipidemia presenting with a cardiac arrest status post resuscitation.  The patient normally lives by himself with home health aide that comes out to help him on a daily basis.  Apparently, she was not able to get in the house and the patient was not answering his door.  The authorities were contacted to perform a wellness check.  Subsequently, they were able to get into the house to find the patient lethargic but responsive.  Upon EMS arrival, the patient required some assistance, but was able to make a transfer to a gurney onto the truck.  After the patient transferred to the truck, he became unresponsive without a pulse.  Chest compressions were instituted.  Spontaneous circulation was obtained after 1 minute of chest compression and bag valve mask ventilation.  The patient was noted to have atrial fibrillation with a heart rate in the 170s.  He was essentially unresponsive during the transport to the emergency department.  In the emergency department, the patient was initially minimally responsive upon transferring to the hospital bed.  However with supportive measures as discussed below, the patient became increasingly more responsive and able to answer appropriately.  History remains limited as the patient remains in extremis at the time of my evaluation.  Much of the history is obtained from review of the medical record and speaking with the patient's son at the bedside.  At baseline, the patient is able to ambulate with assistance of a walker.  He is able to perform most of his ADLs on his own but requires some assistance.  The patient has been drinking liquor on a  daily basis, but states that he has not drank for several days.  In addition, the patient has over 50-pack-year history of tobacco.  He has not smoked for a few days.  Nevertheless, the patient's son states that over the last 6 months that the patient has had a gradual decline in his cognitive and functional status.  The patient himself denies any fevers, chills, chest pain, shortness breath, nausea, vomiting, diarrhea, abdominal pain, dysuria, hematuria, headache, neck pain. In the emergency department, the patient was noted to be tachycardic up into the 160s with hypotension with systolic blood pressure in the 80s.  He was placed on nonrebreather with oxygen saturation 97-100%.  BMP shows sodium 145, potassium 4.5, serum creatinine 3.3.  LFTs include AST 56, ALT 51, alkaline phosphatase 62, total bilirubin of 1.3.  WBC was 16.5 with hemoglobin 12.4 and platelets 233,000.  Initial lactic acid was 3.5.  Chest x-ray showed hyperinflation with increased interstitial markings.  The patient was given 2 L of normal saline.  EDP spoke with cardiology and the patient was started on amiodarone.  Assessment/Plan: Cardiac arrest -Likely incited by the patient's cardiomyopathy in the setting of dehydration and electrolyte abnormalities -Currently in atrial fibrillation on amiodarone drip - consulted cardiology -Echocardiogram  Atrial fibrillation with RVR, type unspecified -Continue amiodarone drip -The patient's hypotension currently precludes use of AV nodal agents -Anticipate improvement with continued rehydration and treatment of possible underlying infectious process -Start IV heparin  Acute respiratory failure with hypoxia and hypercarbia -11/15/2019 ABG 7.2 6/52/248/21 on  80% -Suspect underlying aspiration pneumonitis in the setting of COPD -Currently stable on nonrebreather -Wean oxygen for saturation 88-92% -Chest x-ray shows increased interstitial markings with bibasilar opacities  Aspiration  pneumonitis -Start Zosyn  Lactic acidosis -Multifactorial including volume depletion, hypoxia, and possible infectious process -Continue IV fluids -Blood cultures x2 sets -UA and urine culture -Start vancomycin and Zosyn pending culture data  Acute kidney injury -Patient presented with serum creatinine 3.38 -Serum creatinine 1.83 on [id number]  Chronic systolic and diastolic CHF -The patient appears clinically dry at this time -07/14/2019 echo EF 30-35%, grade 1 DD, global HK, moderate TR, mild MR, RVSP 39.7 -Judicious IV fluids -Daily weights  Alcohol abuse -Patient cannot clarify exactly his last alcoholic drink but states that it has been several days -Alcohol withdrawal protocol -[id number] -folic acid  Tobacco abuse/COPD -Start Xopenex and Atrovent nebulizers -Start Pulmicort  Essential hypertension -Holding carvedilol, losartan in the setting of hypotension and acute kidney injury          Past Medical History:  Diagnosis Date  . Atrial fibrillation ([place])   . Hyperlipidemia   . Hypertension    Past Surgical History:  Procedure Laterality Date  . Cyst removed  1975   Social History:  reports that he has been smoking cigars and cigarettes. He started smoking about 64 years ago. He quit smokeless tobacco use about 4 years ago. He reports current alcohol use. He reports previous drug use.   Family history: Unobtainable secondary to the patient's extremis  Allergies  Allergen Reactions  . Tetanus Toxoids Hives and Swelling     Prior to Admission medications   Medication Sig Start Date End Date Taking? Authorizing Provider  ALPRAZolam (XANAX) 0.5 MG tablet TAKE ONE TABLET BY MOUTH AT BEDTIME AS NEEDED FOR ANXIETY OR SLEEP Patient taking differently: Take 0.5 mg by mouth at bedtime as needed for anxiety or sleep.  03/03/19   Kathyrn Drown, MD  amLODipine (NORVASC) 2.5 MG tablet Take 2.5 mg by mouth daily.    [provider]  aspirin EC 81 MG tablet  Take 81 mg by mouth daily.    [provider]  carvedilol (COREG) 12.5 MG tablet Take 1 tablet (12.5 mg total) by mouth 2 (two) times daily. 07/22/19 11/05/2019  Arnoldo Lenis, MD  doxazosin (CARDURA) 4 MG tablet TAKE 1 TABLET BY MOUTH ONCE DAILY EACH EVENING. Patient taking differently: Take 4 mg by mouth every evening.  03/03/19   Kathyrn Drown, MD  fexofenadine (ALLEGRA) 180 MG tablet Take 180 mg by mouth daily.    [provider]  fluticasone (FLONASE) 50 MCG/ACT nasal spray USE TWO SPRAY(S) IN EACH NOSTRIL ONCE DAILY Patient taking differently: Place 2 sprays into both nostrils daily as needed for allergies or rhinitis.  01/09/18   Kathyrn Drown, MD  ibuprofen (ADVIL) 200 MG tablet Take 200 mg by mouth daily.    [provider]  losartan (COZAAR) 25 MG tablet Take 0.5 tablets (12.5 mg total) by mouth daily. 10/18/19 01/16/20  Verta Ellen., NP  Magnesium Oxide 400 (240 Mg) MG TABS 1 tablet daily Patient taking differently: Take 2 tablets by mouth daily.  05/11/19   Kathyrn Drown, MD  pantoprazole (PROTONIX) 40 MG tablet Take 1 tablet (40 mg total) by mouth daily. 03/03/19   Kathyrn Drown, MD  pravastatin (PRAVACHOL) 80 MG tablet Take 1 tablet (80 mg total) by mouth daily. 03/03/19   Kathyrn Drown, MD  Simethicone (GAS-X PO)  Take 1 capsule by mouth daily as needed (for gas relief). As needed     [provider]    Review of Systems:  Unobtainable secondary to the patient's extremis  Physical Exam: Vitals:   11/03/2019 1453 10/25/2019 1503 11/12/2019 1515 11/05/2019 1518  BP:   98/69   Pulse: (!) 27 (!) 30    Resp: (!) 22 (!) 26 (!) 26 (!) 24  Temp:      TempSrc:      SpO2: 96% 98%    Weight:      Height:       General:  A&O x 1, NAD, nontoxic, pleasant/cooperative Head/Eye: No conjunctival hemorrhage, no icterus, [place]/AT, No nystagmus ENT:  No icterus,  No thrush, good dentition, no pharyngeal exudate Neck:  No masses, no lymphadenpathy, no  bruits CV:  IRRR, no rub, no gallop, no S3 Lung: Bilateral rhonchi Abdomen: soft/NT, +BS, nondistended, no peritoneal signs Ext: No cyanosis, No rashes, No petechiae, No lymphangitis, No edema Neuro: CNII-XII intact, strength 4/5 in bilateral upper and lower extremities, no dysmetria  Labs on Admission:  Basic Metabolic Panel: Recent Labs  Lab 10/23/2019 1232  NA 145  K 4.5  CL 103  CO2 26  GLUCOSE 97  BUN 56*  CREATININE 3.38*  CALCIUM 8.1*   Liver Function Tests: Recent Labs  Lab 11/14/2019 1232  AST 56*  ALT 51*  ALKPHOS 62  BILITOT 1.3*  PROT 5.5*  ALBUMIN 2.6*   No results for input(s): LIPASE, AMYLASE in the last 168 hours. No results for input(s): AMMONIA in the last 168 hours. CBC: Recent Labs  Lab 11/02/2019 1232  WBC 16.5*  NEUTROABS 11.3*  HGB 12.4*  HCT 41.2  MCV 119.4*  PLT 233   Coagulation Profile: No results for input(s): INR, PROTIME in the last 168 hours. Cardiac Enzymes: No results for input(s): CKTOTAL, CKMB, CKMBINDEX, TROPONINI in the last 168 hours. BNP: Invalid input(s): POCBNP CBG: No results for input(s): GLUCAP in the last 168 hours. Urine analysis:    Component Value Date/Time   PROTEINUR Positive (A) 03/31/2019 1114   NITRITE positive 03/31/2019 1114   LEUKOCYTESUR Moderate (2+) (A) 03/31/2019 1114   Sepsis Labs: @LABRCNTIP (procalcitonin:4,lacticidven:4) ) Recent Results (from the past 240 hour(s))  SARS Coronavirus 2 by RT PCR (hospital order, performed in [facility] hospital lab) Nasopharyngeal Nasopharyngeal Swab     Status: None   Collection Time: 11/15/19 12:23 PM   Specimen: Nasopharyngeal Swab  Result Value Ref Range Status   SARS Coronavirus 2 NEGATIVE NEGATIVE Final    Comment: (NOTE) SARS-CoV-2 target nucleic acids are NOT DETECTED. The SARS-CoV-2 RNA is generally detectable in upper and lower respiratory specimens during the acute phase of infection. The lowest concentration of SARS-CoV-2 viral copies this  assay can detect is 250 copies / mL. A negative result does not preclude SARS-CoV-2 infection and should not be used as the sole basis for treatment or other patient management decisions.  A negative result may occur with improper specimen collection / handling, submission of specimen other than nasopharyngeal swab, presence of viral mutation(s) within the areas targeted by this assay, and inadequate number of viral copies (<250 copies / mL). A negative result must be combined with clinical observations, patient history, and epidemiological information. Fact Sheet for Patients:   StrictlyIdeas.no Fact Sheet for Healthcare Providers: BankingDealers.co.za This test is not yet approved or cleared  by the [place] FDA and has been authorized for detection and/or diagnosis of SARS-CoV-2 by FDA  under an Emergency Use Authorization (EUA).  This EUA will remain in effect (meaning this test can be used) for the duration of the COVID-19 declaration under Section 564(b)(1) of the Act, 21 U.S.C. section 360bbb-3(b)(1), unless the authorization is terminated or revoked sooner. Performed at [facility], [address], [place], [place] [address]      Radiological Exams on Admission: DG Chest [facility] 1 View  Result Date: 11/03/2019 CLINICAL DATA:  Shortness of breath. EXAM: PORTABLE CHEST 1 VIEW COMPARISON:  None. FINDINGS: The heart is normal in size. There is mild tortuosity and calcification of the thoracic aorta. The pulmonary hila appear normal. The lungs demonstrate mild hyperinflation with probable emphysematous changes. No infiltrates, edema or effusions. The bony thorax is intact. IMPRESSION: Mild hyperinflation and probable emphysematous changes. No acute pulmonary findings. Electronically Signed   By: Marijo Sanes M.D.   On: 11/10/2019 13:00    EKG: Independently reviewed. afib RVR with nonspecific STT changes    Time spent:70  minutes Code Status:   FULL Family Communication:  Son updated at bedside Disposition Plan: expect 2-3 day hospitalization Consults called: cardiology DVT Prophylaxis: IV heparin  Orson Eva, DO  [facility] Pager [phone]  If 7PM-7AM, please contact night-coverage [email or web address] Password [facility] 11/17/2019, 3:44 PM

## 2019-11-15 NOTE — Progress Notes (Signed)
Called to Er for CPR in progress. Bagged pt for a round of CPR then he regained pulses. Pt returned to 5L [place]

## 2019-11-15 NOTE — Progress Notes (Signed)
ANTICOAGULATION CONSULT NOTE - Initial Consult  Pharmacy Consult for heparin gtt  Indication: atrial fibrillation  Allergies  Allergen Reactions  . Tetanus Toxoids Hives and Swelling    Patient Measurements: Height: 6\' 4"  (193 cm) Weight: 79.4 kg (175 lb) IBW/kg (Calculated) : 86.8 Heparin Dosing Weight: HEPARIN DW (KG): 79.4   Vital Signs: Temp: 98.8 F (37.1 C) (05/24 1224) Temp Source: Rectal (05/24 1224) BP: 96/59 (05/24 1545) Pulse Rate: 31 (05/24 1530)  Labs: Recent Labs    11/09/2019 1232  HGB 12.4*  HCT 41.2  PLT 233  CREATININE 3.38*    Estimated Creatinine Clearance: 20.9 mL/min (A) (by C-G formula based on SCr of 3.38 mg/dL (H)).   Medical History: Past Medical History:  Diagnosis Date  . Atrial fibrillation ([place])   . Hyperlipidemia   . Hypertension     Medications:  (Not in a hospital admission)  Scheduled:  . heparin  4,000 Units Intravenous Once   Infusions:  . amiodarone 60 mg/hr (11/19/2019 1444)  . amiodarone    . heparin    . lactated ringers    . piperacillin-tazobactam (ZOSYN)  IV 3.375 g (11/06/2019 1553)  . vancomycin     PRN:  Anti-infectives (From admission, onward)   Start     Dose/Rate Route Frequency Ordered Stop   11/22/2019 1600  vancomycin (VANCOREADY) IVPB 1500 mg/300 mL     1,500 mg 150 mL/hr over 120 Minutes Intravenous  Once 11/09/2019 1547     10/25/2019 1545  piperacillin-tazobactam (ZOSYN) IVPB 3.375 g     3.375 g 12.5 mL/hr over 240 Minutes Intravenous  Once 10/31/2019 1535        Assessment: Kevin Sweeney a 79 y.o. male requires anticoagulation with a heparin iv infusion for the indication of  atrial fibrillation. Heparin gtt will be started following pharmacy protocol per pharmacy consult. Patient is not on previous oral anticoagulant that will require aPTT/HL correlation before transitioning to only HL monitoring.   Goal of Therapy:  Heparin level 0.3-0.7 units/ml Monitor platelets by anticoagulation protocol: Yes   Plan:  Give 4000 units bolus x 1 Start heparin infusion at 1250 units/hr Check anti-Xa level in 8 hours and daily while on heparin Continue to monitor H&H and platelets  Heparin level to be drawn in 8 hours for patients >49 years old or crcl < 47ml/min  Donna Christen Meher Kucinski 10/26/2019,3:55 PM

## 2019-11-15 NOTE — Progress Notes (Signed)
Pt arrived bagged by EMS after ROSC. Pt placed on 100% NRB w/ 15L. Unable to get an O2 reading. BBS = [place]. Waiting for orders

## 2019-11-15 NOTE — ED Notes (Signed)
Pt became unresponsive. Face became gray. No pulse. Compressions started approx 1 min and epi administered at 1821. Pulse check with return of pulse. Pt awake and making sounds.

## 2019-11-15 NOTE — ED Provider Notes (Addendum)
Called to bedside for coding event.  Patient boarding in the emergency department, admitted to the ICU after cardiac arrest in the field.  On my initial assessment patient is unresponsive and there is no femoral pulse.  We initiated CPR and we administered 1 mg epinephrine IV.  Upon further discussion with nursing staff, prior to this code event he had become more hypotensive, he turned gray color, and then became unresponsive.  After 2 minutes of CPR, there was return of spontaneous circulation.  Strong peripheral pulse, heart rate 140 to 160, blood pressure [id number] systolic.  He seemed to wake and other coherent phrases.  We utilized bag-valve-mask during the 2 minutes of CPR, and with return of spontaneous circulation's he had return of spontaneous breathing.  Providing nasal cannula, saturations in the 90s.  Admitting doctor made aware.  Shortly after, patient's blood pressure began to fall again, at first 90 systolic, going down to 60 systolic, again without clear palpable femoral pulse.  At this point he still exhibited heart rate on the monitor and still showed signs of life, concern for profound hypotension.  Patient was given a push dose of epinephrine, specifically given 1 cc of the code dose epinephrine.  This supported the blood pressure until norepinephrine drip could be started.  Norepinephrine initially started peripherally and then transition to the central line, see procedural details below.  .Critical Care Performed by: Maudie Flakes, MD Authorized by: Maudie Flakes, MD   Critical care provider statement:    Critical care time (minutes):  35   Critical care was necessary to treat or prevent imminent or life-threatening deterioration of the following conditions:  Circulatory failure and cardiac failure (Cardiac arrest)   Critical care was time spent personally by me on the following activities:  Discussions with consultants, evaluation of patient's response to treatment, examination of  patient, ordering and performing treatments and interventions, ordering and review of laboratory studies, ordering and review of radiographic studies, pulse oximetry, re-evaluation of patient's condition, obtaining history from patient or surrogate and review of old charts CPR  Date/Time: 11/10/2019 7:35 PM Performed by: Maudie Flakes, MD Authorized by: Maudie Flakes, MD  CPR Procedure Details:    ACLS/BLS initiated by EMS: No     CPR/ACLS performed in the ED: Yes     Duration of CPR (minutes):  2   Outcome: ROSC obtained    CPR performed via ACLS guidelines under my direct supervision.  See RN documentation for details including defibrillator use, medications, doses and timing. Linus Salmons Line  Date/Time: 10/25/2019 7:36 PM Performed by: Maudie Flakes, MD Authorized by: Maudie Flakes, MD   Consent:    Consent obtained:  Emergent situation Pre-procedure details:    Hand hygiene: Hand hygiene performed prior to insertion     Skin preparation:  2% chlorhexidine Anesthesia (see MAR for exact dosages):    Anesthesia method:  None Procedure details:    Location:  R femoral   Patient position:  Flat   Procedural supplies:  Triple lumen   Catheter size:  7 Fr   Landmarks identified: yes     Ultrasound guidance: yes     Number of attempts:  1   Successful placement: yes   Post-procedure details:    Post-procedure:  Dressing applied and line sutured   Assessment:  Blood return through all ports and free fluid flow   Patient tolerance of procedure:  Tolerated well, no immediate complications Comments:     Aseptic placement  of right femoral CVL but not fully sterile.  Recommend removal within 48 hours.   12 AM update: Called to bedside for repeat assessment, concern for demise.  Patient is still, no signs of life, heart rate 0 on the monitor, no pulse, no breathing, auscultation of the heart reveals no heartbeat.  Patient is deceased.     Maudie Flakes, MD 10/24/2019 Artist Pais     Maudie Flakes, MD Nov 20, 2019 [phone]

## 2019-11-15 NOTE — ED Notes (Signed)
Date and time results received: 11/22/2019 1459 (use smartphrase ".now" to insert current time)  Test: Lactic acid Critical Value: 2.3 Name of Provider Notified: Dr. Eulis Foster  Orders Received? Or Actions Taken?: NA

## 2019-11-15 NOTE — ED Provider Notes (Signed)
[facility] EMERGENCY DEPARTMENT Provider Note   CSN: [id number] Arrival date & time: 11/03/2019  1217     History Chief Complaint  Patient presents with  . Cardiac Arrest    Kevin Sweeney is a 79 y.o. male.  HPI He presents for evaluation of cardiopulmonary arrest.  Sheriff's officer was with him at his home, for welfare check, because he would not answer his door this morning.  After talking to him for about 20 minutes they finally convinced him to come to the hospital by EMS.  EMS was there and began transporting him when he had sudden loss of pulses and decreased breathing.  He received external cardiac manual chest compressions with recovery of pulse within 1 minute.  He was given assisted ventilation by bag valve mask.  He was not responsive during transport.  During transport he was noted to be in atrial fibrillation with heart rate around 170.  On arrival to the emergency department he was placed on stretcher, as he was put onto the ED stretcher, he grunted, then became responsive, initially minimally then able to communicate verbally.  He was unable to give history.  Patient at this time tachypneic and tachycardic.  It was deemed that he did not require urgent intubation, by me.  Sheriff officer who was at home states that the home was not in [place].  He talked to a caregiver who left some food with the patient last week and today it was untouched.  Caregiver reports that the patient has been "drinking a lot of alcohol," since the death of his wife, 1 year ago.  Level 5 caveat-altered mental status    Past Medical History:  Diagnosis Date  . Atrial fibrillation ([place])   . Hyperlipidemia   . Hypertension     Patient Active Problem List   Diagnosis Date Noted  . GERD (gastroesophageal reflux disease) 04/22/2016  . Insomnia 04/22/2016  . Elevated blood pressure reading in office with diagnosis of hypertension 04/22/2016  . Hyponatremia 06/08/2014  . Essential hypertension,  benign 12/02/2012  . Hyperlipidemia 12/02/2012  . Allergic rhinitis 12/02/2012    Past Surgical History:  Procedure Laterality Date  . Cyst removed  1975       No family history on file.  Social History   Tobacco Use  . Smoking status: Current Some Day Smoker    Types: Cigars, Cigarettes    Start date: 02/06/1955  . Smokeless tobacco: Former Systems developer    Quit date: 09/23/2015  Substance Use Topics  . Alcohol use: Yes    Alcohol/week: 0.0 standard drinks    Comment: heavy  . Drug use: Not Currently    Home Medications Prior to Admission medications   Medication Sig Start Date End Date Taking? Authorizing Provider  ALPRAZolam Duanne Moron) 0.5 MG tablet TAKE ONE TABLET BY MOUTH AT BEDTIME AS NEEDED FOR ANXIETY OR SLEEP 03/03/19   Kathyrn Drown, MD  aspirin EC 81 MG tablet Take 81 mg by mouth daily.    [provider]  carvedilol (COREG) 12.5 MG tablet Take 1 tablet (12.5 mg total) by mouth 2 (two) times daily. 07/22/19 10/20/19  Arnoldo Lenis, MD  doxazosin (CARDURA) 4 MG tablet TAKE 1 TABLET BY MOUTH ONCE DAILY EACH EVENING. 03/03/19   Kathyrn Drown, MD  fexofenadine (ALLEGRA) 180 MG tablet Take 180 mg by mouth daily.    [provider]  fluticasone (FLONASE) 50 MCG/ACT nasal spray USE TWO SPRAY(S) IN EACH NOSTRIL ONCE DAILY 01/09/18  Kathyrn Drown, MD  ibuprofen (ADVIL) 200 MG tablet Take 200 mg by mouth daily.    [provider]  losartan (COZAAR) 25 MG tablet Take 0.5 tablets (12.5 mg total) by mouth daily. 10/18/19 01/16/20  Verta Ellen., NP  Magnesium Oxide 400 (240 Mg) MG TABS 1 tablet daily Patient taking differently: 2 tablet daily 05/11/19   Luking, Elayne Snare, MD  OVER THE COUNTER MEDICATION Vit d 3 every day    [provider]  pantoprazole (PROTONIX) 40 MG tablet Take 1 tablet (40 mg total) by mouth daily. 03/03/19   Kathyrn Drown, MD  pravastatin (PRAVACHOL) 80 MG tablet Take 1 tablet (80 mg total) by mouth daily. 03/03/19   Kathyrn Drown, MD  Simethicone (GAS-X PO) Take by mouth. As needed    [provider]    Allergies    Tetanus toxoids  Review of Systems   Review of Systems  Unable to perform ROS: Acuity of condition    Physical Exam Updated Vital Signs BP 97/68   Pulse (!) 27   Temp 98.8 F (37.1 C) (Rectal)   Resp (!) 22   Ht '6\' 4"'  (1.93 m)   Wt 79.4 kg   SpO2 96%   BMI 21.30 kg/m   Physical Exam Vitals and nursing note reviewed.  Constitutional:      General: He is in acute distress.     Appearance: He is well-developed. He is ill-appearing, toxic-appearing and diaphoretic.  HENT:     Head: Normocephalic and atraumatic.     Right Ear: External ear normal.     Left Ear: External ear normal.  Eyes:     Conjunctiva/sclera: Conjunctivae normal.     Pupils: Pupils are equal, round, and reactive to light.  Neck:     Trachea: Phonation normal.  Cardiovascular:     Rate and Rhythm: Regular rhythm. Tachycardia present.     Heart sounds: Normal heart sounds.  Pulmonary:     Effort: Pulmonary effort is normal. No respiratory distress.     Comments: Tachypnea, decreased breath sounds bilaterally left greater than right.  No increased work of breathing. Abdominal:     General: There is no distension.     Palpations: Abdomen is soft. There is no mass.     Tenderness: There is no abdominal tenderness.     Hernia: No hernia is present.  Musculoskeletal:        General: No swelling or tenderness.     Cervical back: Normal range of motion and neck supple.     Right lower leg: No edema.     Left lower leg: No edema.  Skin:    General: Skin is warm.  Neurological:     Mental Status: He is alert.     Cranial Nerves: No cranial nerve deficit.     Motor: No abnormal muscle tone.  Psychiatric:     Comments: Lethargic     ED Results / Procedures / Treatments   Labs (all labs ordered are listed, but only abnormal results are displayed) Labs Reviewed  COMPREHENSIVE METABOLIC PANEL -  Abnormal; Notable for the following components:      Result Value   BUN 56 (*)    Creatinine, Ser 3.38 (*)    Calcium 8.1 (*)    Total Protein 5.5 (*)    Albumin 2.6 (*)    AST 56 (*)    ALT 51 (*)    Total Bilirubin 1.3 (*)  GFR calc non Af Amer 17 (*)    GFR calc Af Amer 19 (*)    Anion gap 16 (*)    All other components within normal limits  CBC WITH DIFFERENTIAL/PLATELET - Abnormal; Notable for the following components:   WBC 16.5 (*)    RBC 3.45 (*)    Hemoglobin 12.4 (*)    MCV 119.4 (*)    MCH 35.9 (*)    nRBC 3.0 (*)    Neutro Abs 11.3 (*)    Monocytes Absolute 1.1 (*)    Abs Immature Granulocytes 0.21 (*)    All other components within normal limits  BLOOD GAS, ARTERIAL - Abnormal; Notable for the following components:   pH, Arterial 7.264 (*)    pCO2 arterial 52.5 (*)    pO2, Arterial 248 (*)    Acid-base deficit 3.0 (*)    All other components within normal limits  LACTIC ACID, PLASMA - Abnormal; Notable for the following components:   Lactic Acid, Venous 3.5 (*)    All other components within normal limits  LACTIC ACID, PLASMA - Abnormal; Notable for the following components:   Lactic Acid, Venous 2.3 (*)    All other components within normal limits  TROPONIN I (HIGH SENSITIVITY) - Abnormal; Notable for the following components:   Troponin I (High Sensitivity) 65 (*)    All other components within normal limits  SARS CORONAVIRUS 2 BY RT PCR (HOSPITAL ORDER, [facility])  CULTURE, BLOOD (ROUTINE X 2)  CULTURE, BLOOD (ROUTINE X 2)  URINE CULTURE  ETHANOL  URINALYSIS, ROUTINE W REFLEX MICROSCOPIC  POC SARS CORONAVIRUS 2 AG -  ED  TROPONIN I (HIGH SENSITIVITY)    EKG EKG Interpretation  Date/Time:  Monday Nov 15 2019 13:02:38 EDT Ventricular Rate:  171 PR Interval:    QRS Duration: 80 QT Interval:  315 QTC Calculation: 535 R Axis:   74 Text Interpretation: Atrial fibrillation with rapid V-rate Ventricular premature  complex Low voltage, extremity leads Nonspecific T abnormalities, lateral leads No old tracing to compare Confirmed by Daleen Bo [phone]) on 11/09/2019 1:22:36 PM   Radiology DG Chest Port 1 View  Result Date: 11/07/2019 CLINICAL DATA:  Shortness of breath. EXAM: PORTABLE CHEST 1 VIEW COMPARISON:  None. FINDINGS: The heart is normal in size. There is mild tortuosity and calcification of the thoracic aorta. The pulmonary hila appear normal. The lungs demonstrate mild hyperinflation with probable emphysematous changes. No infiltrates, edema or effusions. The bony thorax is intact. IMPRESSION: Mild hyperinflation and probable emphysematous changes. No acute pulmonary findings. Electronically Signed   By: Marijo Sanes M.D.   On: 11/07/2019 13:00    Procedures .Critical Care Performed by: Daleen Bo, MD Authorized by: Daleen Bo, MD   Critical care provider statement:    Critical care time (minutes):  55   Critical care start time:  10/29/2019 12:25 PM   Critical care end time:  10/28/2019 2:37 PM   Critical care time was exclusive of:  Separately billable procedures and treating other patients   Critical care was necessary to treat or prevent imminent or life-threatening deterioration of the following conditions:  Circulatory failure and cardiac failure   Critical care was time spent personally by me on the following activities:  Blood draw for specimens, development of treatment plan with patient or surrogate, discussions with consultants, evaluation of patient's response to treatment, examination of patient, obtaining history from patient or surrogate, ordering and performing treatments and interventions, ordering and  review of laboratory studies, pulse oximetry, re-evaluation of patient's condition, review of old charts and ordering and review of radiographic studies   (including critical care time)  Medications Ordered in ED Medications  amiodarone (NEXTERONE PREMIX) 360-4.14  MG/200ML-% (1.8 mg/mL) IV infusion (60 mg/hr Intravenous New Bag/Given 10/23/2019 1444)  amiodarone (NEXTERONE PREMIX) 360-4.14 MG/200ML-% (1.8 mg/mL) IV infusion (has no administration in time range)  sodium chloride 0.9 % bolus 1,000 mL (0 mLs Intravenous Stopped 10/23/2019 1319)  sodium chloride 0.9 % bolus 1,000 mL (1,000 mLs Intravenous New Bag/Given 10/26/2019 1429)    ED Course  I have reviewed the triage vital signs and the nursing notes.  Pertinent labs & imaging results that were available during my care of the patient were reviewed by me and considered in my medical decision making (see chart for details).  Clinical Course as of Nov 14 1508  Mon Nov 15, 2019  1324 Normal except BUN high, creatinine high, calcium low, total protein low, albumin low, AST high, ALT high, total bilirubin high, GFR low, anion gap high  Comprehensive metabolic panel(!) [EW]  2595 Abnormal, pH low, PCO2 high, PO2 high  Blood gas, arterial(!) [EW]  1324 Normal  Ethanol [EW]  1325 Normal except white count high, hemoglobin low  CBC with Differential(!) [EW]  1326 No CHF or infiltrate, interpreted by me  DG Chest Port 1 View [EW]  1406 CO2: 26 [EW]  1422 Patient son arrived and is able to give additional information, including confirming that the patient has been lying on the couch for 2 to 3 days.  Also patient is generally losing weight, and today was much more confused than usual.  Patient is full code, desiring intubation and cardiac resuscitation if needed.  I discussed this, with the son present.  Son confirmed patient has been drinking a lot, usually beer and vodka, then falling asleep, sometimes in the afternoon and then waking up early a.m.  Patient son feels like his dad is back to baseline, at this time.   [EW]  1434 Case discussed with cardiology, who will see as a Optometrist.  He advises initiation of amiodarone infusion only, without bolus, to treat rapid heartbeat.  Discussed EKG, together, and feel  that the patient may have atrial flutter versus less likely atrial fibrillation.   [EW]  1435 SARS Coronavirus 2 by RT PCR (hospital order, performed in [place] hospital lab) Nasopharyngeal Nasopharyngeal Swab [EW]  1436 Normal  POC SARS Coronavirus 2 Ag-ED - Nasal Swab (BD Veritor Kit) [EW]  1436 Mild elevation  Troponin I (High Sensitivity)(!) [EW]  1509 BUN(!): 56 [EW]    Clinical Course User Index [EW] Daleen Bo, MD   MDM Rules/Calculators/A&P     CHA2DS2-VASc Score: 3                 The patient is noted to have a MAP's <65/ SBP's <90. With the current information available to me, I don't think the patient is in septic shock. The MAP's <65/ SBP's <90, is related to an acute condition that is not due to an infectionAcute kidney injury.   Patient Vitals for the past 24 hrs:  BP Temp Temp src Pulse Resp SpO2 Height Weight  11/11/2019 1453 - - - (!) 27 (!) 22 96 % - -  11/12/2019 1445 97/68 - - - (!) 32 - - -  11/08/2019 1444 - - - - (!) 28 - - -  11/02/2019 1433 - - - (!) 49 (!) 23  100 % - -  11/08/2019 1431 (!) 90/54 - - - (!) 23 - - -  11/19/2019 1425 (!) 86/72 - - - (!) 33 - - -  11/22/2019 1400 (!) 80/68 - - - (!) 27 - - -  11/02/2019 1356 - - - - - 97 % - -  11/08/2019 1322 - - - - - 100 % - -  11/22/2019 1300 92/66 - - (!) 28 20 94 % - -  11/20/2019 1253 103/85 - - - (!) 26 - - -  11/13/2019 1235 - - - - - 99 % - -  11/08/2019 1230 (!) 90/58 - - - (!) 30 - - -  11/17/2019 1229 - - - - (!) 30 - - -  11/03/2019 1226 - - - - - - '6\' 4"'  (1.93 m) 79.4 kg  11/08/2019 1224 90/66 98.8 F (37.1 C) Rectal (!) 155 - - - -    2:36 PM Reevaluation with update and discussion. After initial assessment and treatment, an updated evaluation reveals patient is mentating well, able to contribute to history.  He prefers to remain full code. Daleen Bo   Medical Decision Making:  This patient is presenting for evaluation of cardiovascular collapse, difficulty breathing and altered mental status, which does  require a range of treatment options, and is a complaint that involves a high risk of morbidity and mortality. The differential diagnoses include stroke, myocardial infarction, sepsis, metabolic instability. I decided to review old records, and in summary he is a debilitated elderly man with cardiomyopathy, hypertension, and heavy alcohol use.  He was recently treated for peripheral edema..  I got additional historical information from his son at the bedside.  Clinical Laboratory Tests Ordered, included CBC, Metabolic panel, Urinalysis and Troponin, Covid testing, lactate, blood cultures. Review indicates troponin slightly elevated, much higher than baseline, BUN high, lactate high, improving with fluids to 2.3, arterial blood gas with PCO2 elevated and PO2 high.. Radiologic Tests Ordered, included chest x-ray, no infiltrate or CHF.  I independently Visualized: Radiograph images, which show no acute infection or heart failure  Cardiac Monitor Tracing which shows rapid atrial fibrillation/flutter   I discussed the case results results with Dr. Domenic Polite, cardiologist, he will see patient as consult.   Critical Interventions-patient presented initially for checkup, had cardiac arrest witnessed by EMS, improved after cardiac chest compressions for 1 minute.  Patient required assisted ventilation during transport and arrived was able to maintain saturations, and communicate.  Apparent new onset atrial fibrillation present with clinical and laboratory evidence for dehydration/acute kidney injury.  Initial treatment with fluid resuscitation, and supportive oxygenation.    After These Interventions, the Patient was reevaluated and was found improved, conversant and able to do accurately answer questions and follow a historical timeline.  Patient was saw his cardiologist, 10/18/2019; at which time he had medication adjustment.  His amlodipine was stopped and he was started on losartan.  Currently has had  some leg swelling, and variable blood pressures while on amlodipine.  He has history of cardiomyopathy, with decreased ejection fraction 30 to 35%.  CRITICAL CARE-yes Performed by: Daleen Bo  Nursing Notes Reviewed/ Care Coordinated Applicable Imaging Reviewed Interpretation of Laboratory Data incorporated into ED treatment  2:40 PM-Consult complete with hospitalist. Patient case explained and discussed.  He agrees to admit patient for further evaluation and treatment. Call ended at 3:07 PM  Plan: Admit    Final Clinical Impression(s) / ED Diagnoses Final diagnoses:  Cardiac arrest ([place])  Rapid  atrial fibrillation ([facility])  Acute kidney injury ([place])  Elevated serum lactate dehydrogenase    Rx / DC Orders ED Discharge Orders    None       Daleen Bo, MD 11/12/2019 1510

## 2019-11-15 NOTE — ED Notes (Signed)
Date and time results received: 10/25/2019 1332 (use smartphrase ".now" to insert current time)  Test: lactic Critical Value: 3.5  Name of Provider Notified: [email or web address]  Orders Received? Or Actions Taken?:

## 2019-11-15 NOTE — ED Notes (Signed)
Dr. Carles Collet in room with pt and pt's son

## 2019-11-15 NOTE — ED Notes (Signed)
Dr Sedonia Small at bedside placing central line

## 2019-11-16 LAB — HEPARIN LEVEL (UNFRACTIONATED): Heparin Unfractionated: 0.69 IU/mL (ref 0.30–0.70)

## 2019-11-17 ENCOUNTER — Ambulatory Visit: Payer: PPO | Admitting: Family Medicine

## 2019-11-17 LAB — URINE CULTURE: Culture: NO GROWTH

## 2019-11-18 MED FILL — Medication: Qty: 1 | Status: AC

## 2019-11-20 LAB — CULTURE, BLOOD (ROUTINE X 2)
Culture: NO GROWTH
Culture: NO GROWTH
Special Requests: ADEQUATE

## 2019-11-23 NOTE — ED Notes (Signed)
Patient time of death occurred at 00:05.

## 2019-11-23 NOTE — ED Notes (Signed)
RN to room to assess patient. Pt had a decrease in breathing  And drop in blood pressure 45/37. hospitalist paged. EDP to room. Pt stopped breathing and decrease in cardiac activity came soon after. Son notified of death.

## 2019-11-23 DEATH — deceased

## 2020-04-19 ENCOUNTER — Other Ambulatory Visit: Payer: PPO

## 2021-04-17 IMAGING — DX DG CHEST 1V PORT
2 series · 2 of 2 positions shown · non-contrast
Comparison: None.

CLINICAL DATA: Shortness of breath.

EXAM:
PORTABLE CHEST 1 VIEW

[chest ap (1 of 2)]
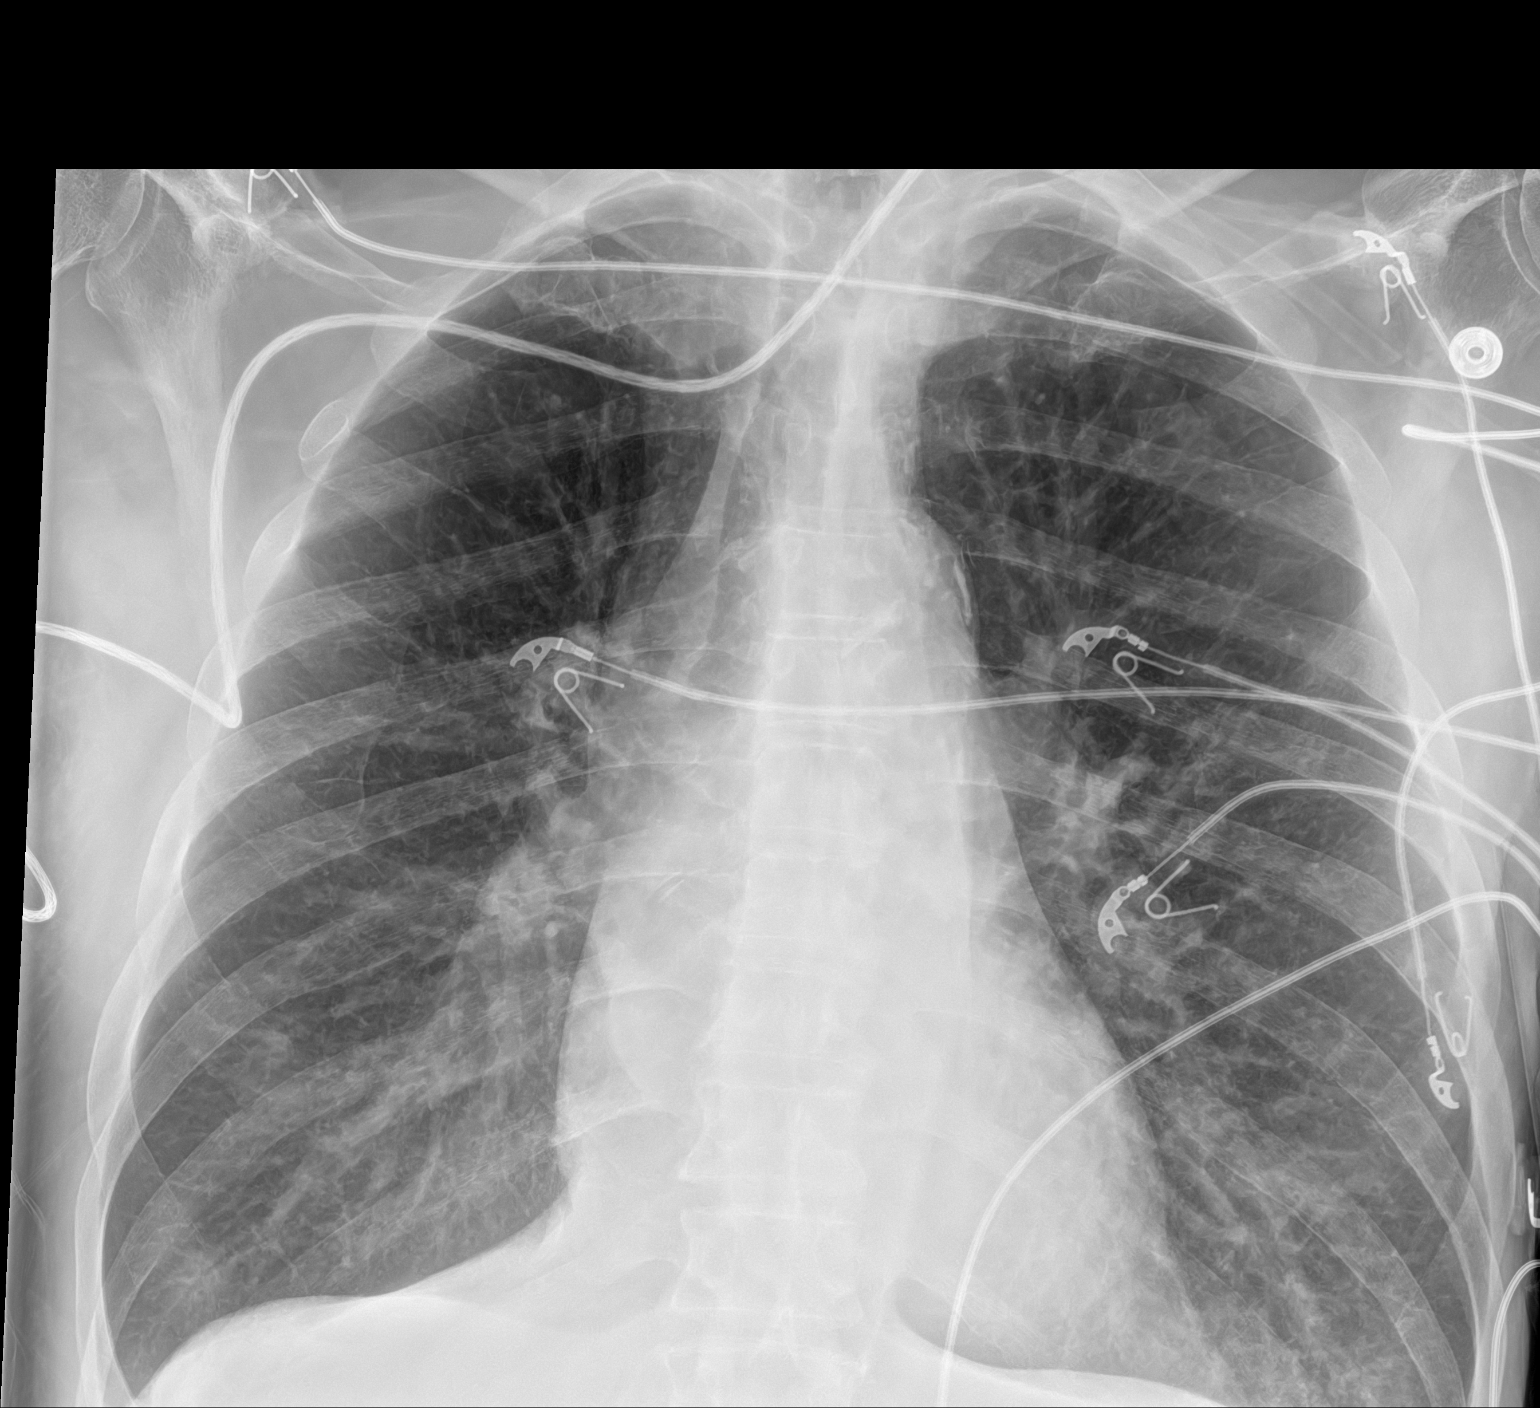

[chest ap (2 of 2)]
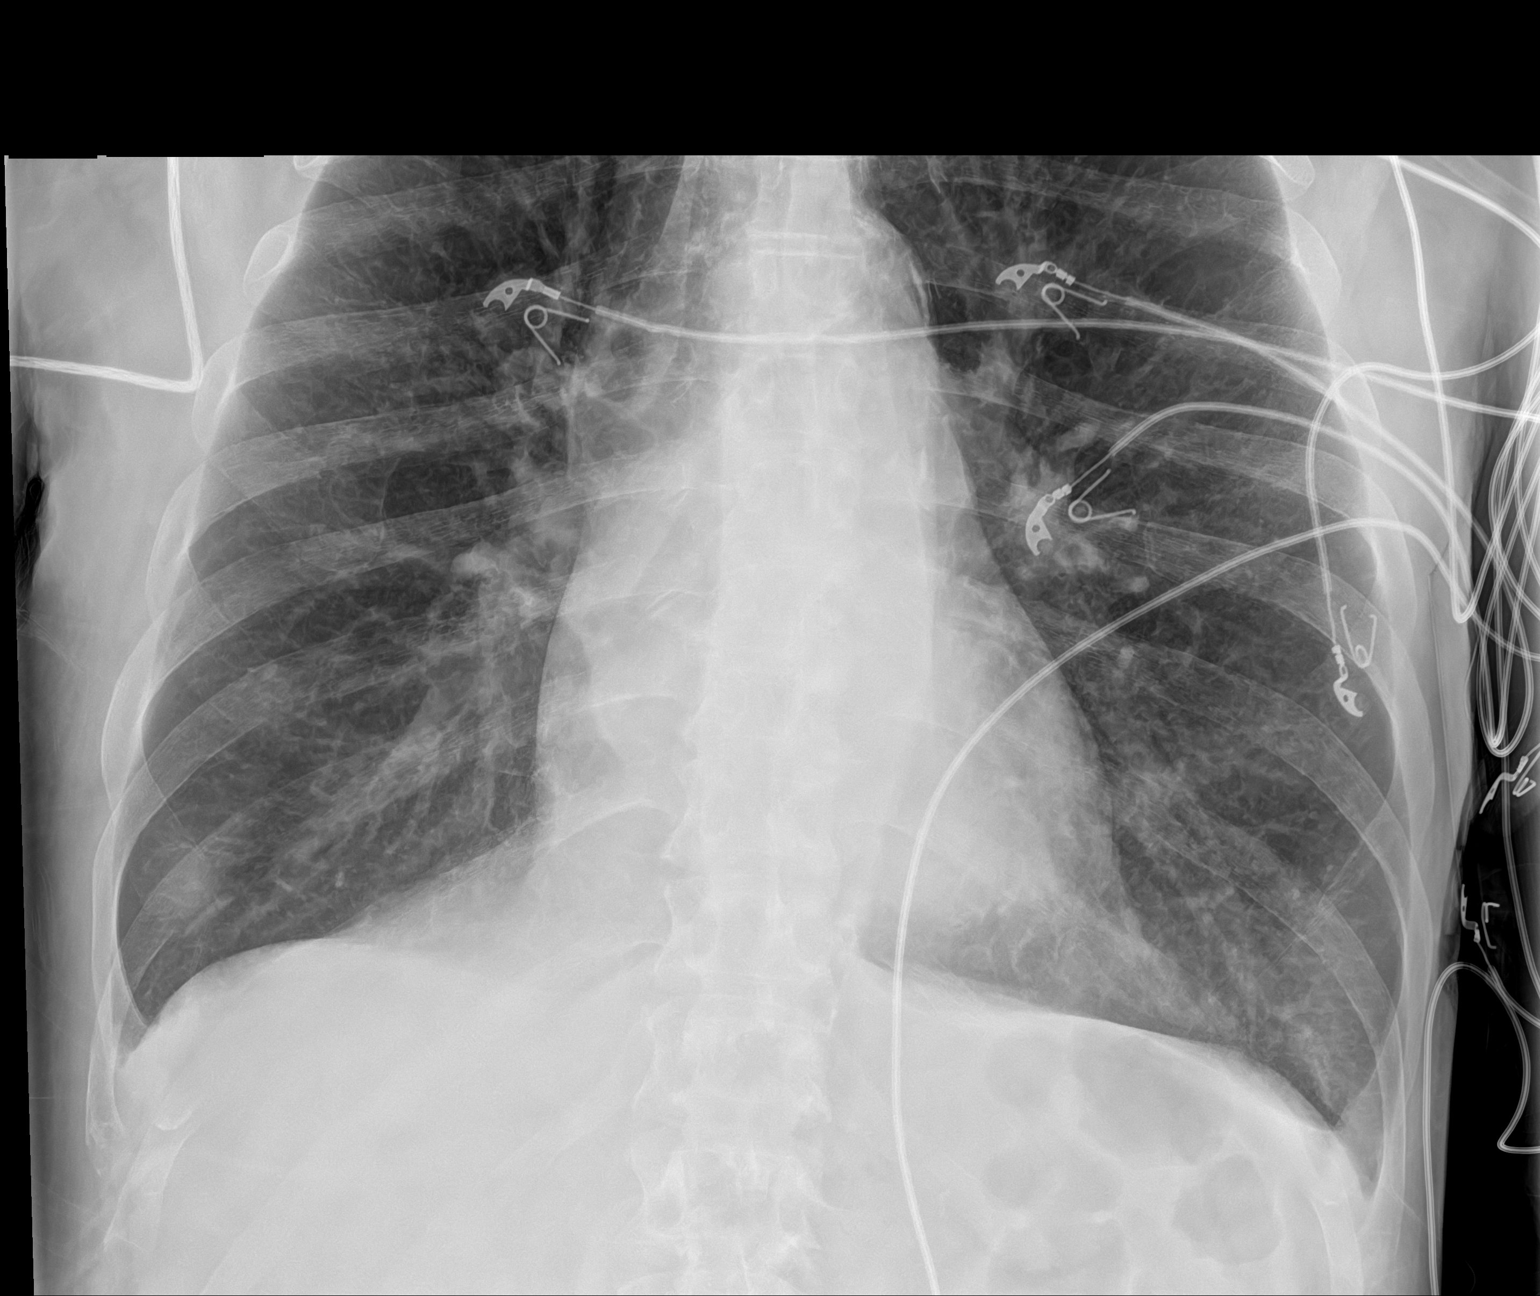

[2 of 2 positions shown; findings below may reference images not displayed]

FINDINGS: The heart is normal in size. There is mild tortuosity and
calcification of the thoracic aorta. The pulmonary hila appear
normal. The lungs demonstrate mild hyperinflation with probable
emphysematous changes. No infiltrates, edema or effusions. The bony
thorax is intact.
IMPRESSION: Mild hyperinflation and probable emphysematous changes. No acute
pulmonary findings.
